# Patient Record
Sex: Male | Born: 1990 | Race: Black or African American | Hispanic: No | Marital: Single | Smoking: Current some day smoker
Health system: Southern US, Community
[De-identification: ages and names within clinical notes are randomized; demographics above are authoritative.]

## PROBLEM LIST (undated history)

## (undated) DIAGNOSIS — F419 Anxiety disorder, unspecified: Secondary | ICD-10-CM

---
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
## 2009-01-23 ENCOUNTER — Emergency Department (HOSPITAL_BASED_OUTPATIENT_CLINIC_OR_DEPARTMENT_OTHER): Admission: EM | Admit: 2009-01-23 | Discharge: 2009-01-23 | Payer: Self-pay | Admitting: Emergency Medicine

## 2009-02-18 ENCOUNTER — Emergency Department (HOSPITAL_COMMUNITY): Admission: EM | Admit: 2009-02-18 | Discharge: 2009-02-19 | Payer: Self-pay | Admitting: Emergency Medicine

## 2009-03-17 ENCOUNTER — Emergency Department (HOSPITAL_COMMUNITY): Admission: EM | Admit: 2009-03-17 | Discharge: 2009-03-17 | Payer: Self-pay | Admitting: Emergency Medicine

## 2010-02-02 ENCOUNTER — Emergency Department (HOSPITAL_BASED_OUTPATIENT_CLINIC_OR_DEPARTMENT_OTHER): Admission: EM | Admit: 2010-02-02 | Discharge: 2010-02-02 | Payer: Self-pay | Admitting: Emergency Medicine

## 2010-09-10 LAB — BASIC METABOLIC PANEL
BUN: 9 mg/dL (ref 6–23)
CO2: 31 mEq/L (ref 19–32)
Calcium: 9.5 mg/dL (ref 8.4–10.5)
Chloride: 103 mEq/L (ref 96–112)
Creatinine, Ser: 0.9 mg/dL (ref 0.4–1.5)
GFR calc Af Amer: >60 mL/min (ref >60)
GFR calc non Af Amer: >60 mL/min (ref >60)
Glucose, Bld: 102 mg/dL — ABNORMAL HIGH (ref 70–99)
Potassium: 3.6 mEq/L (ref 3.5–5.1)
Sodium: 138 mEq/L (ref 135–145)

## 2010-09-10 LAB — CBC
HCT: 46.2 % (ref 39.0–52.0)
Hemoglobin: 15.8 g/dL (ref 13.0–17.0)
Platelets: 210 10*3/uL (ref 150–400)
RBC: 4.8 MIL/uL (ref 4.22–5.81)
WBC: 7.7 10*3/uL (ref 4.0–10.5)

## 2010-09-10 LAB — DIFFERENTIAL
Eosinophils Absolute: 0.1 10*3/uL (ref 0.0–0.7)
Lymphs Abs: 2.1 10*3/uL (ref 0.7–4.0)
Neutro Abs: 4.6 10*3/uL (ref 1.7–7.7)
Neutrophils Relative %: 60 % (ref 43–77)

## 2010-09-10 LAB — CK TOTAL AND CKMB (NOT AT ARMC): Relative Index: 0.5 (ref 0.0–2.5)

## 2010-09-10 LAB — D-DIMER, QUANTITATIVE: D-Dimer, Quant: <0.22 ug/mL-FEU (ref 0.00–0.48)

## 2011-09-13 ENCOUNTER — Emergency Department (HOSPITAL_COMMUNITY)
Admission: EM | Admit: 2011-09-13 | Discharge: 2011-09-13 | Disposition: A | Discharge status: Home / Self Care | Payer: Self-pay | Attending: Emergency Medicine | Admitting: Emergency Medicine

## 2011-09-13 ENCOUNTER — Encounter (HOSPITAL_COMMUNITY): Payer: Self-pay

## 2011-09-13 DIAGNOSIS — F17200 Nicotine dependence, unspecified, uncomplicated: Secondary | ICD-10-CM | POA: Insufficient documentation

## 2011-09-13 DIAGNOSIS — S2095XA Superficial foreign body of unspecified parts of thorax, initial encounter: Secondary | ICD-10-CM | POA: Insufficient documentation

## 2011-09-13 DIAGNOSIS — W57XXXA Bitten or stung by nonvenomous insect and other nonvenomous arthropods, initial encounter: Secondary | ICD-10-CM

## 2011-09-13 MED ORDER — LIDOCAINE HCL 1 % IJ SOLN
INTRAMUSCULAR | Status: AC
  Filled 2011-09-13: qty 20

## 2011-09-13 NOTE — Discharge Instructions (Signed)
Insect Bite Mosquitoes, flies, fleas, bedbugs, and many other insects can bite. Insect bites are different from insect stings. A sting is when venom is injected into the skin. Some insect bites can transmit infectious diseases. SYMPTOMS  Insect bites usually turn red, swell, and itch for 2 to 4 days. They often go away on their own. TREATMENT  Your caregiver may prescribe antibiotic medicines if a bacterial infection develops in the bite. HOME CARE INSTRUCTIONS  Do not scratch the bite area.   Keep the bite area clean and dry. Wash the bite area thoroughly with soap and water.   Put ice or cool compresses on the bite area.   Put ice in a plastic bag.   Place a towel between your skin and the bag.   Leave the ice on for 20 minutes, 4 times a day for the first 2 to 3 days, or as directed.   You may apply a baking soda paste, cortisone cream, or calamine lotion to the bite area as directed by your caregiver. This can help reduce itching and swelling.   Only take over-the-counter or prescription medicines as directed by your caregiver.   If you are given antibiotics, take them as directed. Finish them even if you start to feel better.  You may need a tetanus shot if:  You cannot remember when you had your last tetanus shot.   You have never had a tetanus shot.   The injury broke your skin.  If you get a tetanus shot, your arm may swell, get red, and feel warm to the touch. This is common and not a problem. If you need a tetanus shot and you choose not to have one, there is a rare chance of getting tetanus. Sickness from tetanus can be serious. SEEK IMMEDIATE MEDICAL CARE IF:   You have increased pain, redness, or swelling in the bite area.   You see a red line on the skin coming from the bite.   You have a fever.   You have joint pain.   You have a headache or neck pain.   You have unusual weakness.   You have a rash.   You have chest pain or shortness of breath.   You  have abdominal pain, nausea, or vomiting.   You feel unusually tired or sleepy.  MAKE SURE YOU:   Understand these instructions.   Will watch your condition.   Will get help right away if you are not doing well or get worse.  Document Released: 06/30/2004 Document Revised: 05/12/2011 Document Reviewed: 12/22/2010 Exit[employer] Patient Information 2012 [employer]. 

## 2011-09-13 NOTE — ED Notes (Signed)
Pt visits the ED with a compliant that a tick is on his right buttock that he noticed today. Denies fever or joint pain. During inspection small reddened area under the right buttock with a black area in the middle that appears as if something it stuck in it. Pt states that it is tender but no moderate pain.

## 2011-09-13 NOTE — ED Provider Notes (Signed)
History     CSN: [id number]  Arrival date & time 09/13/11  2043   First MD Initiated Contact with Patient 09/13/11 2308      Chief Complaint  Patient presents with  . Insect Bite    (Consider location/radiation/quality/duration/timing/severity/associated sxs/prior treatment) Patient is a 21 y.o. male presenting with foreign body. The history is provided by the patient. No language interpreter was used.  Foreign Body  The current episode started 12 to 24 hours ago.  Patient spent some time in a wooded area yesterday.  Noted what appeared to be a small tick in the fold under the right buttock with mild induration.  Patient attempted to remove, but left part of the body and head of the tick behind.  History reviewed. No pertinent past medical history.  History reviewed. No pertinent past surgical history.  No family history on file.  History  Substance Use Topics  . Smoking status: Current Some Day Smoker    Types: Cigarettes  . Smokeless tobacco: Not on file  . Alcohol Use: No      Review of Systems  Skin: Positive for wound.  All other systems reviewed and are negative.    Allergies  Review of patient's allergies indicates no known allergies.  Home Medications  No current outpatient prescriptions on file.  BP 118/74  Temp(Src) 98 F (36.7 C) (Oral)  Resp 14  Ht 6\' 2"  (1.88 m)  Wt 175 lb (79.379 kg)  BMI 22.47 kg/m2  SpO2 100%  Physical Exam  Nursing note and vitals reviewed. Constitutional: He is oriented to person, place, and time. He appears well-developed and well-nourished. No distress.  HENT:  Head: Normocephalic and atraumatic.  Eyes: Pupils are equal, round, and reactive to light.  Neck: Normal range of motion. Neck supple.  Cardiovascular: Normal rate, regular rhythm, normal heart sounds and intact distal pulses.   Pulmonary/Chest: Effort normal and breath sounds normal.  Abdominal: Soft. Bowel sounds are normal.  Musculoskeletal: Normal range  of motion.  Lymphadenopathy:    He has no cervical adenopathy.  Neurological: He is alert and oriented to person, place, and time.  Skin: Skin is warm and dry.  Psychiatric: He has a normal mood and affect. His behavior is normal. Judgment and thought content normal.    ED Course  Procedures (including critical care time) Retained foreign body removed with hemostats. Labs Reviewed - No data to display No results found.   No diagnosis found.   Retained foreign body (tick), removed MDM          Jimmye Norman, NP 09/14/11 [phone]

## 2011-09-14 NOTE — ED Provider Notes (Signed)
Medical screening examination/treatment/procedure(s) were performed by non-physician practitioner and as supervising physician I was immediately available for consultation/collaboration.   Vida Roller, MD 09/14/11 [phone]

## 2013-01-20 ENCOUNTER — Encounter (HOSPITAL_COMMUNITY): Payer: Self-pay | Admitting: *Deleted

## 2013-01-20 ENCOUNTER — Emergency Department (HOSPITAL_COMMUNITY)
Admission: EM | Admit: 2013-01-20 | Discharge: 2013-01-20 | Disposition: A | Discharge status: Home / Self Care | Payer: Self-pay | Attending: Emergency Medicine | Admitting: Emergency Medicine

## 2013-01-20 DIAGNOSIS — R112 Nausea with vomiting, unspecified: Secondary | ICD-10-CM | POA: Insufficient documentation

## 2013-01-20 DIAGNOSIS — R319 Hematuria, unspecified: Secondary | ICD-10-CM | POA: Insufficient documentation

## 2013-01-20 DIAGNOSIS — R109 Unspecified abdominal pain: Secondary | ICD-10-CM | POA: Insufficient documentation

## 2013-01-20 DIAGNOSIS — F17200 Nicotine dependence, unspecified, uncomplicated: Secondary | ICD-10-CM | POA: Insufficient documentation

## 2013-01-20 LAB — COMPREHENSIVE METABOLIC PANEL
ALT: 18 U/L (ref 0–53)
AST: 19 U/L (ref 0–37)
Albumin: 4.3 g/dL (ref 3.5–5.2)
Alkaline Phosphatase: 54 U/L (ref 39–117)
BUN: 11 mg/dL (ref 6–23)
CO2: 30 mEq/L (ref 19–32)
Calcium: 9.8 mg/dL (ref 8.4–10.5)
Chloride: 102 mEq/L (ref 96–112)
Creatinine, Ser: 1.08 mg/dL (ref 0.50–1.35)
GFR calc Af Amer: >90 mL/min (ref >90)
GFR calc non Af Amer: >90 mL/min (ref >90)
Glucose, Bld: 113 mg/dL — ABNORMAL HIGH (ref 70–99)
Potassium: 4.3 mEq/L (ref 3.5–5.1)
Sodium: 140 mEq/L (ref 135–145)
Total Bilirubin: 0.4 mg/dL (ref 0.3–1.2)
Total Protein: 7 g/dL (ref 6.0–8.3)

## 2013-01-20 LAB — URINALYSIS, ROUTINE W REFLEX MICROSCOPIC
Glucose, UA: NEGATIVE mg/dL
Ketones, ur: NEGATIVE mg/dL
Protein, ur: 30 mg/dL — AB

## 2013-01-20 LAB — CBC WITH DIFFERENTIAL/PLATELET
Basophils Absolute: 0 10*3/uL (ref 0.0–0.1)
HCT: 44.4 % (ref 39.0–52.0)
Lymphocytes Relative: 16 % (ref 12–46)
Monocytes Absolute: 0.8 10*3/uL (ref 0.1–1.0)
Neutro Abs: 6.8 10*3/uL (ref 1.7–7.7)
Platelets: 197 10*3/uL (ref 150–400)
RDW: 12.6 % (ref 11.5–15.5)
WBC: 9 10*3/uL (ref 4.0–10.5)

## 2013-01-20 LAB — RAPID URINE DRUG SCREEN, HOSP PERFORMED
Amphetamines: NOT DETECTED
Tetrahydrocannabinol: POSITIVE — AB

## 2013-01-20 LAB — URINE MICROSCOPIC-ADD ON

## 2013-01-20 LAB — LIPASE, BLOOD: Lipase: 41 U/L (ref 11–59)

## 2013-01-20 MED ORDER — SODIUM CHLORIDE 0.9 % IV BOLUS (SEPSIS)
1000.0000 mL | Freq: Once | INTRAVENOUS | Status: AC
  Administered 2013-01-20: 1000 mL via INTRAVENOUS

## 2013-01-20 MED ORDER — ONDANSETRON 4 MG PO TBDP
8.0000 mg | ORAL_TABLET | Freq: Once | ORAL | Status: AC
  Administered 2013-01-20: 8 mg via ORAL
  Filled 2013-01-20: qty 2

## 2013-01-20 MED ORDER — PROMETHAZINE HCL 25 MG PO TABS: 25.0000 mg | ORAL_TABLET | Freq: Four times a day (QID) | ORAL | Status: DC | PRN

## 2013-01-20 MED ORDER — ONDANSETRON HCL 4 MG/2ML IJ SOLN
4.0000 mg | Freq: Once | INTRAMUSCULAR | Status: AC
  Administered 2013-01-20: 4 mg via INTRAVENOUS
  Filled 2013-01-20: qty 2

## 2013-01-20 MED ORDER — MORPHINE SULFATE 4 MG/ML IJ SOLN
4.0000 mg | Freq: Once | INTRAMUSCULAR | Status: AC
  Administered 2013-01-20: 4 mg via INTRAVENOUS
  Filled 2013-01-20: qty 1

## 2013-01-20 NOTE — ED Notes (Signed)
Discharge instructions reviewed with pt. Pt verbalized understanding.   

## 2013-01-20 NOTE — ED Provider Notes (Signed)
Medical screening examination/treatment/procedure(s) were performed by non-physician practitioner and as supervising physician I was immediately available for consultation/collaboration.  Etheridge Geil N Ligia Duguay, DO 01/20/13 1653 

## 2013-01-20 NOTE — ED Provider Notes (Signed)
CSN: [id number]     Arrival date & time 01/20/13  6213 History     First MD Initiated Contact with Patient 01/20/13 1020     Chief Complaint  Patient presents with  . Vomiting   (Consider location/radiation/quality/duration/timing/severity/associated sxs/prior Treatment) HPI Patient is a 22 year old male who came into the emergency department today complaining of nausea, vomiting, abdominal pain. Patient states his pain began this morning it woke him up from sleep. Patient has had around 6 episodes of emesis with the last episode here in the emergency department in triage. Patient states his pain did improve with the patient denies any changes in his bowels, denies diarrhea. Patient denies any fever or chills. Patient denies taking any medications for these symptoms at home. Patient states he did eat Chinese last night and believes he might have eaten something bad. Patient denies any prior medical problems, denies any prior abdominal injuries or surgeries. Patient denies any alcohol or drug use. History reviewed. No pertinent past medical history. History reviewed. No pertinent past surgical history. History reviewed. No pertinent family history. History  Substance Use Topics  . Smoking status: Current Some Day Smoker    Types: Cigarettes  . Smokeless tobacco: Not on file  . Alcohol Use: No    Review of Systems  Constitutional: Negative for fever and chills.  HENT: Negative for neck pain and neck stiffness.   Respiratory: Negative.   Cardiovascular: Negative.   Gastrointestinal: Positive for nausea, vomiting and abdominal pain. Negative for diarrhea and constipation.  Genitourinary: Negative for dysuria and flank pain.  Musculoskeletal: Negative for back pain.  Skin: Negative.   Neurological: Negative for weakness, numbness and headaches.    Allergies  Review of patient's allergies indicates no known allergies.  Home Medications  No current outpatient prescriptions on  file. BP 124/67  Pulse 67  Temp(Src) 98 F (36.7 C) (Oral)  Resp 16  SpO2 100% Physical Exam  Nursing note and vitals reviewed. Constitutional: He appears well-developed and well-nourished. No distress.  HENT:  Head: Normocephalic and atraumatic.  Eyes: Conjunctivae are normal.  Neck: Neck supple.  Cardiovascular: Normal rate, regular rhythm and normal heart sounds.   Pulmonary/Chest: Effort normal. No respiratory distress. He has no wheezes. He has no rales.  Abdominal: Soft. Bowel sounds are normal. He exhibits no distension. There is no tenderness. There is no rebound.  Musculoskeletal: He exhibits no edema.  Neurological: He is alert.  Skin: Skin is warm and dry.    ED Course   Procedures (including critical care time) Results for orders placed during the hospital encounter of 01/20/13  CBC WITH DIFFERENTIAL      Result Value Range   WBC 9.0  4.0 - 10.5 K/uL   RBC 4.97  4.22 - 5.81 MIL/uL   Hemoglobin 16.1  13.0 - 17.0 g/dL   HCT 08.6  57.8 - 46.9 %   MCV 89.3  78.0 - 100.0 fL   MCH 32.4  26.0 - 34.0 pg   MCHC 36.3 (*) 30.0 - 36.0 g/dL   RDW 62.9  52.8 - 41.3 %   Platelets 197  150 - 400 K/uL   Neutrophils Relative % 75  43 - 77 %   Neutro Abs 6.8  1.7 - 7.7 K/uL   Lymphocytes Relative 16  12 - 46 %   Lymphs Abs 1.4  0.7 - 4.0 K/uL   Monocytes Relative 9  3 - 12 %   Monocytes Absolute 0.8  0.1 - 1.0 K/uL  Eosinophils Relative 1  0 - 5 %   Eosinophils Absolute 0.1  0.0 - 0.7 K/uL   Basophils Relative 0  0 - 1 %   Basophils Absolute 0.0  0.0 - 0.1 K/uL  COMPREHENSIVE METABOLIC PANEL      Result Value Range   Sodium 140  135 - 145 mEq/L   Potassium 4.3  3.5 - 5.1 mEq/L   Chloride 102  96 - 112 mEq/L   CO2 30  19 - 32 mEq/L   Glucose, Bld 113 (*) 70 - 99 mg/dL   BUN 11  6 - 23 mg/dL   Creatinine, Ser 1.61  0.50 - 1.35 mg/dL   Calcium 9.8  8.4 - 09.6 mg/dL   Total Protein 7.0  6.0 - 8.3 g/dL   Albumin 4.3  3.5 - 5.2 g/dL   AST 19  0 - 37 U/L   ALT 18  0 -  53 U/L   Alkaline Phosphatase 54  39 - 117 U/L   Total Bilirubin 0.4  0.3 - 1.2 mg/dL   GFR calc non Af Amer >90  >90 mL/min   GFR calc Af Amer >90  >90 mL/min  LIPASE, BLOOD      Result Value Range   Lipase 41  11 - 59 U/L  URINALYSIS, ROUTINE W REFLEX MICROSCOPIC      Result Value Range   Color, Urine AMBER (*) YELLOW   APPearance CLOUDY (*) CLEAR   Specific Gravity, Urine 1.028  1.005 - 1.030   pH 5.5  5.0 - 8.0   Glucose, UA NEGATIVE  NEGATIVE mg/dL   Hgb urine dipstick LARGE (*) NEGATIVE   Bilirubin Urine SMALL (*) NEGATIVE   Ketones, ur NEGATIVE  NEGATIVE mg/dL   Protein, ur 30 (*) NEGATIVE mg/dL   Urobilinogen, UA 0.2  0.0 - 1.0 mg/dL   Nitrite NEGATIVE  NEGATIVE   Leukocytes, UA NEGATIVE  NEGATIVE  URINE RAPID DRUG SCREEN (HOSP PERFORMED)      Result Value Range   Opiates NONE DETECTED  NONE DETECTED   Cocaine NONE DETECTED  NONE DETECTED   Benzodiazepines NONE DETECTED  NONE DETECTED   Amphetamines NONE DETECTED  NONE DETECTED   Tetrahydrocannabinol POSITIVE (*) NONE DETECTED   Barbiturates NONE DETECTED  NONE DETECTED  URINE MICROSCOPIC-ADD ON      Result Value Range   Squamous Epithelial / LPF RARE  RARE   WBC, UA 0-2  <3 WBC/hpf   RBC / HPF 21-50  <3 RBC/hpf   Urine-Other MUCOUS PRESENT     No results found.   No results found. 1. Nausea & vomiting   2. Abdominal  pain, other specified site   3. Hematuria     MDM  Patient with nausea vomiting, diffuse abdominal pain onset this morning. Patient has not vomited in the emergency department since arrival. He is treated with IV fluids, Zofran, morphine for pain. Patient states he's feeling much better. He received 2 L of normal saline. His labs are all unremarkable. His urine did show large hemoglobin small bilirubin. This will need further recheck for followup patient's abdomen reassessed, it is non-tender. There is no guarding or rebound tenderness. Doubt acute abdomen at this time. Patient is tolerating by  mouth fluids. Patient will be discharged home with antiemetics and close outpatient followup. Patient was instructed to return to emergency Department if symptoms are worsening.  Filed Vitals:   01/20/13 1100 01/20/13 1200 01/20/13 1251 01/20/13 1300  BP: 102/61 93/45 112/66  117/65  Pulse: 55 58 60 67  Temp:      TempSrc:      Resp:      SpO2: 100% 100% 100% 99%     Kayani Rapaport A Ethan Clayburn, PA-C 01/20/13 1328

## 2013-01-20 NOTE — ED Notes (Signed)
Pt actively vomiting at triage, will not answer any questions, will not sit still for blood draw.

## 2013-01-20 NOTE — ED Notes (Signed)
Tatyana PA at bedside   

## 2014-03-19 ENCOUNTER — Emergency Department (HOSPITAL_COMMUNITY)
Admission: EM | Admit: 2014-03-19 | Discharge: 2014-03-20 | Disposition: A | Discharge status: Home / Self Care | Payer: Self-pay | Attending: Emergency Medicine | Admitting: Emergency Medicine

## 2014-03-19 ENCOUNTER — Encounter (HOSPITAL_COMMUNITY): Payer: Self-pay | Admitting: Emergency Medicine

## 2014-03-19 DIAGNOSIS — R05 Cough: Secondary | ICD-10-CM | POA: Insufficient documentation

## 2014-03-19 DIAGNOSIS — R059 Cough, unspecified: Secondary | ICD-10-CM

## 2014-03-19 DIAGNOSIS — R0982 Postnasal drip: Secondary | ICD-10-CM | POA: Insufficient documentation

## 2014-03-19 DIAGNOSIS — J029 Acute pharyngitis, unspecified: Secondary | ICD-10-CM | POA: Insufficient documentation

## 2014-03-19 DIAGNOSIS — Z720 Tobacco use: Secondary | ICD-10-CM | POA: Insufficient documentation

## 2014-03-19 HISTORY — DX: Anxiety disorder, unspecified: F41.9

## 2014-03-19 NOTE — ED Notes (Signed)
Pt reports feeling something in his throat, states he thinks it's mucus and keeps trying to cough it out but it's still there. Pt reports it's making him feels anxious.

## 2014-03-19 NOTE — ED Provider Notes (Signed)
CSN: [id number]     Arrival date & time 03/19/14  2247 History   First MD Initiated Contact with Patient 03/19/14 2336     Chief Complaint  Patient presents with  . Foreign Body  . Anxiety     (Consider location/radiation/quality/duration/timing/severity/associated sxs/prior Treatment) Patient is a 23 y.o. male presenting with pharyngitis.  Sore Throat This is a new problem. Episode onset: 1 month ago. The problem occurs constantly. The problem has not changed since onset.Pertinent negatives include no chest pain, no abdominal pain, no headaches and no shortness of breath. Nothing aggravates the symptoms. Nothing relieves the symptoms. He has tried nothing for the symptoms.    Past Medical History  Diagnosis Date  . Anxiety    History reviewed. No pertinent past surgical history. No family history on file. History  Substance Use Topics  . Smoking status: Current Some Day Smoker    Types: Cigarettes  . Smokeless tobacco: Not on file  . Alcohol Use: No    Review of Systems  Respiratory: Negative for shortness of breath.   Cardiovascular: Negative for chest pain.  Gastrointestinal: Negative for abdominal pain.  Neurological: Negative for headaches.  All other systems reviewed and are negative.     Allergies  Review of patient's allergies indicates no known allergies.  Home Medications   Prior to Admission medications   Medication Sig Start Date End Date Taking? Authorizing Provider  benzonatate (TESSALON) 100 MG capsule Take 1 capsule (100 mg total) by mouth every 8 (eight) hours. 03/20/14   Mirian MoMatthew Braelon Sprung, MD   BP 124/74  Pulse 58  Temp(Src) 97.7 F (36.5 C) (Oral)  Resp 16  Ht 6\' 2"  (1.88 m)  Wt 165 lb (74.844 kg)  BMI 21.18 kg/m2  SpO2 100% Physical Exam  Vitals reviewed. Constitutional: He is oriented to person, place, and time. He appears well-developed and well-nourished.  HENT:  Head: Normocephalic and atraumatic.  Mouth/Throat: Oropharynx is clear  and moist and mucous membranes are normal.  Eyes: Conjunctivae and EOM are normal.  Neck: Normal range of motion. Neck supple.  Cardiovascular: Normal rate, regular rhythm and normal heart sounds.   Pulmonary/Chest: Effort normal and breath sounds normal. No respiratory distress.  Abdominal: He exhibits no distension. There is no tenderness. There is no rebound and no guarding.  Musculoskeletal: Normal range of motion.  Neurological: He is alert and oriented to person, place, and time.  Skin: Skin is warm and dry.    ED Course  Procedures (including critical care time) Labs Review Labs Reviewed - No data to display  Imaging Review Dg Chest 2 View  03/20/2014   CLINICAL DATA:  Initial evaluation for 1 month history of cough.  EXAM: CHEST  2 VIEW  COMPARISON:  Prior radiograph from 02/19/2009  FINDINGS: The cardiac and mediastinal silhouettes are stable in size and contour, and remain within normal limits.  The lungs are normally inflated. No airspace consolidation, pleural effusion, or pulmonary edema is identified. There is no pneumothorax.  No acute osseous abnormality identified.  IMPRESSION: No active cardiopulmonary disease.   Electronically Signed   By: Rise MuBenjamin  McClintock M.D.   On: 03/20/2014 01:12     EKG Interpretation None      MDM   Final diagnoses:  Cough  Post-nasal drip    23 y.o. male with pertinent PMH of anxiety presents with 1 month of nonproductive cough, rhinorrhea, foreign body sensation in throat.  No foreign bodies ingested or aspirated on history.  No fever, gi  symptoms, or other concerning historical elements.  On arrival vitals and physical exam as above.  Oropharynx clear, small amount of LAD bil anterior cervical chains.  Pt is a current smoker.  CXR obtained and unremarkable.  Pt given prescription for tussionex and is to take OTC lozenge and antihistamine.  DC home in stable condition.    1. Cough   2. Post-nasal drip         Mirian MoMatthew Nasire Reali,  MD 03/20/14 [phone]

## 2014-03-20 ENCOUNTER — Emergency Department (HOSPITAL_COMMUNITY): Payer: Self-pay

## 2014-03-20 MED ORDER — BENZONATATE 100 MG PO CAPS: 100.0000 mg | ORAL_CAPSULE | Freq: Three times a day (TID) | ORAL | Status: AC

## 2014-03-20 NOTE — Discharge Instructions (Signed)
Cough, Adult  A cough is a reflex that helps clear your throat and airways. It can help heal the body or may be a reaction to an irritated airway. A cough may only last 2 or 3 weeks (acute) or may last more than 8 weeks (chronic).  CAUSES Acute cough:  Viral or bacterial infections. Chronic cough:  Infections.  Allergies.  Asthma.  Post-nasal drip.  Smoking.  Heartburn or acid reflux.  Some medicines.  Chronic lung problems (COPD).  Cancer. SYMPTOMS   Cough.  Fever.  Chest pain.  Increased breathing rate.  High-pitched whistling sound when breathing (wheezing).  Colored mucus that you cough up (sputum). TREATMENT   A bacterial cough may be treated with antibiotic medicine.  A viral cough must run its course and will not respond to antibiotics.  Your caregiver may recommend other treatments if you have a chronic cough. HOME CARE INSTRUCTIONS   Only take over-the-counter or prescription medicines for pain, discomfort, or fever as directed by your caregiver. Use cough suppressants only as directed by your caregiver.  Use a cold steam vaporizer or humidifier in your bedroom or home to help loosen secretions.  Sleep in a semi-upright position if your cough is worse at night.  Rest as needed.  Stop smoking if you smoke. SEEK IMMEDIATE MEDICAL CARE IF:   You have pus in your sputum.  Your cough starts to worsen.  You cannot control your cough with suppressants and are losing sleep.  You begin coughing up blood.  You have difficulty breathing.  You develop pain which is getting worse or is uncontrolled with medicine.  You have a fever. MAKE SURE YOU:   Understand these instructions.  Will watch your condition.  Will get help right away if you are not doing well or get worse. Document Released: 11/19/2010 Document Revised: 08/15/2011 Document Reviewed: 11/19/2010 [employer] Patient Information 2015 [employer]. This information is not intended  to replace advice given to you by your health care provider. Make sure you discuss any questions you have with your health care provider.  

## 2014-08-15 ENCOUNTER — Encounter (HOSPITAL_COMMUNITY): Payer: Self-pay | Admitting: Emergency Medicine

## 2014-08-15 ENCOUNTER — Emergency Department (HOSPITAL_COMMUNITY)
Admission: EM | Admit: 2014-08-15 | Discharge: 2014-08-15 | Disposition: A | Discharge status: Home / Self Care | Payer: Self-pay | Attending: Emergency Medicine | Admitting: Emergency Medicine

## 2014-08-15 DIAGNOSIS — H53149 Visual discomfort, unspecified: Secondary | ICD-10-CM | POA: Insufficient documentation

## 2014-08-15 DIAGNOSIS — R51 Headache: Secondary | ICD-10-CM | POA: Insufficient documentation

## 2014-08-15 DIAGNOSIS — Z8659 Personal history of other mental and behavioral disorders: Secondary | ICD-10-CM | POA: Insufficient documentation

## 2014-08-15 DIAGNOSIS — H109 Unspecified conjunctivitis: Secondary | ICD-10-CM | POA: Insufficient documentation

## 2014-08-15 DIAGNOSIS — Z720 Tobacco use: Secondary | ICD-10-CM | POA: Insufficient documentation

## 2014-08-15 MED ORDER — TOBRAMYCIN 0.3 % OP SOLN: 1.0000 [drp] | OPHTHALMIC | Status: AC

## 2014-08-15 MED ORDER — FLUORESCEIN SODIUM 1 MG OP STRP
1.0000 | ORAL_STRIP | Freq: Once | OPHTHALMIC | Status: AC
  Administered 2014-08-15: 1 via OPHTHALMIC
  Filled 2014-08-15: qty 1

## 2014-08-15 MED ORDER — TETRACAINE HCL 0.5 % OP SOLN
1.0000 [drp] | Freq: Once | OPHTHALMIC | Status: AC
  Administered 2014-08-15: 1 [drp] via OPHTHALMIC
  Filled 2014-08-15: qty 2

## 2014-08-15 NOTE — ED Notes (Signed)
Pt reports waking up this morning feeling like something was in his eye. Pt states it feels like his eye his scratched. Pt unable to go to work because he is unable to open right eye.

## 2014-08-15 NOTE — ED Provider Notes (Signed)
CSN: [id number]     Arrival date & time 08/15/14  1953 History  This chart was scribed for Antony Madura, PA-C working with Jerelyn Scott, MD by Evon Slack, ED Scribe. This patient was seen in room WTR9/WTR9 and the patient's care was started at 9:58 PM.     Chief Complaint  Patient presents with  . Eye Pain   The history is provided by the patient. No language interpreter was used.   HPI Comments: Bryan Stein is a 24 y.o. male who presents to the Emergency Department complaining of new worsening right eye pain that began 1 day ago. Pt states that he has associated eye itching, eye redness and eye discharge morning. Pt states that light makes the pain worse causing him to have a HA. Pt states that he has tried eye drops with no relief. Pt denies fever, eye discharge or other related symptoms. Pt denies any trauma to the eye. Pt states that he doesn not wear contacts.   Past Medical History  Diagnosis Date  . Anxiety    History reviewed. No pertinent past surgical history. History reviewed. No pertinent family history. History  Substance Use Topics  . Smoking status: Current Some Day Smoker    Types: Cigarettes  . Smokeless tobacco: Not on file  . Alcohol Use: No    Review of Systems  Constitutional: Negative for fever.  Eyes: Positive for photophobia, pain, redness and itching. Negative for discharge.  Neurological: Positive for headaches.  All other systems reviewed and are negative.   Allergies  Review of patient's allergies indicates no known allergies.  Home Medications   Prior to Admission medications   Medication Sig Start Date End Date Taking? Authorizing Provider  benzonatate (TESSALON) 100 MG capsule Take 1 capsule (100 mg total) by mouth every 8 (eight) hours. 03/20/14   Mirian Mo, MD  tobramycin (TOBREX) 0.3 % ophthalmic solution Place 1 drop into the right eye every 4 (four) hours. Place one drop in the affected eye every 4 hours for 7 days 08/15/14    Antony Madura, PA-C   BP 127/73 mmHg  Pulse 71  Temp(Src) 98.5 F (36.9 C) (Oral)  Resp 16  SpO2 100%   Physical Exam  Constitutional: He is oriented to person, place, and time. He appears well-developed and well-nourished. No distress.  Nontoxic/nonseptic appearing  HENT:  Head: Normocephalic and atraumatic.  Mouth/Throat: Oropharynx is clear and moist. No oropharyngeal exudate.  Eyes: EOM are normal. Pupils are equal, round, and reactive to light. Lids are everted and swept, no foreign bodies found. Right conjunctiva is injected. Right conjunctiva has no hemorrhage. Left conjunctiva is not injected. Left conjunctiva has no hemorrhage. No scleral icterus.  No proptosis or hyphema. No foreign body visualized. No fluorescein uptake on staining. No evidence of corneal abrasion or ulcer. Negative Sidel's sign. IOP 18 with 95% CI. No consensual photophobia. Snellen 20/30 OS, OD, OU.  Neck: Normal range of motion.  Pulmonary/Chest: Effort normal. No respiratory distress.  Musculoskeletal: Normal range of motion.  Neurological: He is alert and oriented to person, place, and time. He exhibits normal muscle tone. Coordination normal.  Skin: Skin is warm and dry. No rash noted. He is not diaphoretic. No erythema. No pallor.  Psychiatric: He has a normal mood and affect. His behavior is normal.  Nursing note and vitals reviewed.   ED Course  Procedures (including critical care time) DIAGNOSTIC STUDIES: Oxygen Saturation is 100% on RA, normal by my interpretation.  COORDINATION OF CARE: 10:20 PM-Discussed treatment plan with pt at bedside and pt agreed to plan.   Labs Review Labs Reviewed - No data to display  Imaging Review No results found.   EKG Interpretation None      MDM   Final diagnoses:  Conjunctivitis of right eye    24 year old male percent to the emergency department for further evaluation of right eye pain and redness. No foreign body visualized. No evidence of  corneal abrasion or ulcer. No findings concerning for globe rupture. Normal intraocular pressure, ruling out acute glaucoma. No history of trauma or injury to the eye. Suspect that symptoms are secondary to viral conjunctivitis, will cover for bacterial conjunctivitis with Tobrex. Patient given ophthalmology follow-up for recheck of symptoms on Monday. Return precautions provided. Patient agreeable to plan with known address concerns. Case discussed with Dr. Karma GanjaLinker who is in agreement with work up and management plan.  I personally performed the services described in this documentation, which was scribed in my presence. The recorded information has been reviewed and is accurate.   Filed Vitals:   08/15/14 2036  BP: 127/73  Pulse: 71  Temp: 98.5 F (36.9 C)  TempSrc: Oral  Resp: 16  SpO2: 100%       Antony MaduraKelly Cortez Steelman, PA-C 08/15/14 2302  Jerelyn ScottMartha Linker, MD 08/15/14 2303

## 2014-08-15 NOTE — Discharge Instructions (Signed)

## 2014-08-15 NOTE — ED Notes (Signed)
Called for room, no answer

## 2014-10-04 ENCOUNTER — Emergency Department (HOSPITAL_COMMUNITY)
Admission: EM | Admit: 2014-10-04 | Discharge: 2014-10-04 | Disposition: A | Discharge status: Home / Self Care | Payer: Self-pay | Attending: Emergency Medicine | Admitting: Emergency Medicine

## 2014-10-04 ENCOUNTER — Encounter (HOSPITAL_COMMUNITY): Payer: Self-pay

## 2014-10-04 DIAGNOSIS — H6121 Impacted cerumen, right ear: Secondary | ICD-10-CM | POA: Insufficient documentation

## 2014-10-04 DIAGNOSIS — R0981 Nasal congestion: Secondary | ICD-10-CM | POA: Insufficient documentation

## 2014-10-04 DIAGNOSIS — J029 Acute pharyngitis, unspecified: Secondary | ICD-10-CM | POA: Insufficient documentation

## 2014-10-04 DIAGNOSIS — M791 Myalgia: Secondary | ICD-10-CM | POA: Insufficient documentation

## 2014-10-04 DIAGNOSIS — R067 Sneezing: Secondary | ICD-10-CM | POA: Insufficient documentation

## 2014-10-04 DIAGNOSIS — R05 Cough: Secondary | ICD-10-CM | POA: Insufficient documentation

## 2014-10-04 DIAGNOSIS — R509 Fever, unspecified: Secondary | ICD-10-CM | POA: Insufficient documentation

## 2014-10-04 DIAGNOSIS — Z8659 Personal history of other mental and behavioral disorders: Secondary | ICD-10-CM | POA: Insufficient documentation

## 2014-10-04 DIAGNOSIS — Z720 Tobacco use: Secondary | ICD-10-CM | POA: Insufficient documentation

## 2014-10-04 DIAGNOSIS — R6889 Other general symptoms and signs: Secondary | ICD-10-CM

## 2014-10-04 DIAGNOSIS — R51 Headache: Secondary | ICD-10-CM | POA: Insufficient documentation

## 2014-10-04 MED ORDER — GUAIFENESIN 100 MG/5ML PO LIQD: 100.0000 mg | ORAL | Status: AC | PRN

## 2014-10-04 NOTE — Discharge Instructions (Signed)

## 2014-10-04 NOTE — ED Notes (Signed)
Pt presents with c/o cough and sore throat that started two days ago. Pt reports he feels pain in his throat and is also experiencing pain in his head when he coughs. Pt reports he has been taking nyquil for the symptoms.

## 2014-10-04 NOTE — ED Provider Notes (Signed)
CSN: [id number]     Arrival date & time 10/04/14  1819 History   This chart was scribed for Fayrene HelperBowie Rydan Gulyas, PA-C working with Azalia BilisKevin Campos, MD by Evon Slackerrance Branch, ED Scribe. This patient was seen in room WTR8/WTR8 and the patient's care was started at 6:32 PM.      Chief Complaint  Patient presents with  . Sore Throat  . Cough   Patient is a 24 y.o. male presenting with pharyngitis and cough. The history is provided by the patient. No language interpreter was used.  Sore Throat Associated symptoms include headaches.  Cough Associated symptoms: chills, fever, headaches, myalgias and sore throat    HPI Comments: Bryan Stein is a 24 y.o. male who presents to the Emergency Department complaining of cough onset 2 days prior. Pt states that he has a HA that's worse when coughing and moving his eyes. Pt states that he has associated subjective fever, chills, sore throat, congestion, generilaized myalgias, occasional cough, and sneezing. PT states that he has tried Nyquil and soaking in epsom salt with no relief. Pt denies nausea, vomiting, or diarrhea.     Past Medical History  Diagnosis Date  . Anxiety    History reviewed. No pertinent past surgical history. No family history on file. History  Substance Use Topics  . Smoking status: Current Some Day Smoker    Types: Cigarettes  . Smokeless tobacco: Not on file  . Alcohol Use: No    Review of Systems  Constitutional: Positive for fever and chills.  HENT: Positive for congestion, sneezing and sore throat.   Respiratory: Positive for cough.   Gastrointestinal: Negative for nausea, vomiting and diarrhea.  Musculoskeletal: Positive for myalgias.  Neurological: Positive for headaches.    Allergies  Review of patient's allergies indicates no known allergies.  Home Medications   Prior to Admission medications   Medication Sig Start Date End Date Taking? Authorizing Provider  benzonatate (TESSALON) 100 MG capsule Take 1 capsule (100  mg total) by mouth every 8 (eight) hours. 03/20/14   Mirian MoMatthew Gentry, MD  tobramycin (TOBREX) 0.3 % ophthalmic solution Place 1 drop into the right eye every 4 (four) hours. Place one drop in the affected eye every 4 hours for 7 days 08/15/14   Antony MaduraKelly Humes, PA-C   BP 118/77 mmHg  Pulse 93  Temp(Src) 98.8 F (37.1 C) (Oral)  Resp 22  SpO2 100%   Physical Exam  Constitutional: He is oriented to person, place, and time. He appears well-developed and well-nourished. No distress.  HENT:  Head: Normocephalic and atraumatic.  Left Ear: External ear normal.  Mouth/Throat: Uvula is midline. No oropharyngeal exudate or posterior oropharyngeal edema.  Right ear cerumen impaction. No tonsillar enlargement or exudate.   Eyes: Conjunctivae and EOM are normal.  Neck: Neck supple. No tracheal deviation present.  Cardiovascular: Normal rate, regular rhythm and normal heart sounds.   Pulmonary/Chest: Effort normal. No respiratory distress. He has no wheezes.  Musculoskeletal: Normal range of motion.  Lymphadenopathy:    He has no cervical adenopathy.  No lymph adenopathy.    Neurological: He is alert and oriented to person, place, and time.  Skin: Skin is warm and dry.  Psychiatric: He has a normal mood and affect. His behavior is normal.  Nursing note and vitals reviewed.   ED Course  Procedures (including critical care time) DIAGNOSTIC STUDIES: Oxygen Saturation is 100% on RA, normal by my interpretation.    COORDINATION OF CARE: 6:42 PM-flu like sxs.  Vss.  Low suspicion for strep pharyngitis.  Doubt meningitis. No hypoxia or fever concerning for pna. Discussed treatment plan with pt at bedside and pt agreed to plan.     Labs Review Labs Reviewed - No data to display  Imaging Review No results found.   EKG Interpretation None      MDM   Final diagnoses:  Flu-like symptoms   BP 118/77 mmHg  Pulse 93  Temp(Src) 98.8 F (37.1 C) (Oral)  Resp 22  SpO2 100%   I personally  performed the services described in this documentation, which was scribed in my presence. The recorded information has been reviewed and is accurate.       Fayrene Helper, PA-C 10/04/14 1850  Azalia Bilis, MD 10/04/14 [phone]

## 2017-02-11 ENCOUNTER — Emergency Department (HOSPITAL_COMMUNITY): Admission: EM | Admit: 2017-02-11 | Discharge: 2017-02-11 | Discharge status: Against Medical Advice | Payer: Self-pay

## 2017-02-11 NOTE — ED Triage Notes (Signed)
Called for triage, no response.

## 2017-02-11 NOTE — ED Notes (Signed)
Pt was called for triage for 2nd time, no response

## 2021-09-18 ENCOUNTER — Other Ambulatory Visit: Payer: Self-pay

## 2021-09-18 ENCOUNTER — Emergency Department: Payer: Self-pay

## 2021-09-18 ENCOUNTER — Emergency Department
Admission: EM | Admit: 2021-09-18 | Discharge: 2021-09-18 | Disposition: A | Discharge status: Home / Self Care | Payer: Self-pay | Attending: Emergency Medicine | Admitting: Emergency Medicine

## 2021-09-18 DIAGNOSIS — R519 Headache, unspecified: Secondary | ICD-10-CM

## 2021-09-18 DIAGNOSIS — U071 COVID-19: Secondary | ICD-10-CM | POA: Insufficient documentation

## 2021-09-18 DIAGNOSIS — R0602 Shortness of breath: Secondary | ICD-10-CM | POA: Insufficient documentation

## 2021-09-18 LAB — BASIC METABOLIC PANEL
Anion gap: 8 (ref 5–15)
BUN: 10 mg/dL (ref 6–20)
CO2: 27 mmol/L (ref 22–32)
Calcium: 9.1 mg/dL (ref 8.9–10.3)
Chloride: 100 mmol/L (ref 98–111)
Creatinine, Ser: 1.08 mg/dL (ref 0.61–1.24)
GFR, Estimated: >60 mL/min (ref >60)
Glucose, Bld: 92 mg/dL (ref 70–99)
Potassium: 4.6 mmol/L (ref 3.5–5.1)
Sodium: 135 mmol/L (ref 135–145)

## 2021-09-18 LAB — RESP PANEL BY RT-PCR (FLU A&B, COVID) ARPGX2
Influenza A by PCR: NEGATIVE
Influenza B by PCR: NEGATIVE
SARS Coronavirus 2 by RT PCR: POSITIVE — AB

## 2021-09-18 LAB — TROPONIN I (HIGH SENSITIVITY)
Troponin I (High Sensitivity): 4 ng/L (ref <18)
Troponin I (High Sensitivity): <2 ng/L (ref <18)

## 2021-09-18 LAB — CBC
HCT: 48 % (ref 39.0–52.0)
Hemoglobin: 16.2 g/dL (ref 13.0–17.0)
MCH: 31.3 pg (ref 26.0–34.0)
MCHC: 33.8 g/dL (ref 30.0–36.0)
MCV: 92.7 fL (ref 80.0–100.0)
Platelets: 241 10*3/uL (ref 150–400)
RBC: 5.18 MIL/uL (ref 4.22–5.81)
RDW: 12.7 % (ref 11.5–15.5)
WBC: 5.6 10*3/uL (ref 4.0–10.5)
nRBC: 0 % (ref 0.0–0.2)

## 2021-09-18 LAB — SEDIMENTATION RATE: Sed Rate: 4 mm/hr (ref 0–15)

## 2021-09-18 MED ORDER — METHYLPREDNISOLONE 4 MG PO TBPK: ORAL_TABLET | ORAL | 0 refills | Status: AC

## 2021-09-18 MED ORDER — IOHEXOL 350 MG/ML SOLN
75.0000 mL | Freq: Once | INTRAVENOUS | Status: AC | PRN
  Administered 2021-09-18: 75 mL via INTRAVENOUS

## 2021-09-18 MED ORDER — IPRATROPIUM BROMIDE HFA 17 MCG/ACT IN AERS: 2.0000 | INHALATION_SPRAY | Freq: Four times a day (QID) | RESPIRATORY_TRACT | 2 refills | Status: AC

## 2021-09-18 NOTE — ED Notes (Signed)
Patient was triaged by Roylene Reason, RN but was accidentally charted under Inda Merlin, EDT. ?

## 2021-09-18 NOTE — Discharge Instructions (Addendum)
Use the Atrovent inhaler 1 puff 4 times a day with a spacer.  Take the Medrol Dosepak taper.  Follow-up with pulmonary medicine.  I have placed a consult for you they should call you to set up the appointment.  If you have not got a call or if you are still experiencing symptoms after 3-4 more days please do not hesitate to return.  I have also put in the general surgeon Dr. Hampton Abbot.  His contact information is elsewhere on this front sheet.  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  There is not any other sign of anything going on on the CAT scan. ? ?If the steroids do not help the headache as well as the cough use Motrin 3 of the over-the-counter pills 3 times a day for about a week.  That should help.  If you not having a headache of course do not take the Motrin. ? ?I would also follow-up with primary care.  If you have insurance you can see [facility] or [facility] or [facility] or [facility] or .  If you do not have insurance you can try to follow-up with the [facility] or the [facility] clinic or the [facility] or [facility] or [facility].  Most of these require some paperwork and a little bit difficult to get in because of wait times.  If you get any worse do not hesitate to come back. ? ?

## 2021-09-18 NOTE — ED Notes (Signed)
D/C, f/up, new RX's and reasons to return to ED discussed with pt, pt verbalized understanding NAD noted. Pt ambulatory with steady gait on D/C.  ?

## 2021-09-18 NOTE — ED Triage Notes (Signed)
Pt to ED via POV c/o headache, fatigue and chest tightness x 1 month. Pt states that he took OTC medication the first 2 days of his symptoms. Pt reports that his brother was diagnosed with COVID about a week ago. Pt states that his symptoms have been worse over the past few days. Pt is in NAD.  ?

## 2021-09-18 NOTE — ED Provider Notes (Signed)
? ?[facility] ?Provider Note ? ? ? Event Date/Time  ? First MD Initiated Contact with Patient 09/18/21 1027   ?  (approximate) ? ? ?History  ? ?Headache, Fatigue, and Chest Pain ? ? ?HPI ? ?Bryan Stein is a 31 y.o. male who has been sick for about a week with a cough which is mostly nonproductive diffuse achy headache and feeling very tired.  Patient reports that headache is severe and is made worse by just touching his head.  He also has a lump on the right side of his face and the right eyebrow that has been there for some time but is gotten bigger recently.  He says when the headache gets bad it looks like the blood vessels going towards it and are dilated.  Patient also complains of a cough which is sometimes intractable always dry and moderate pleuritic chest pain.  He feels short of breath frequently.  He reports that his brother has similar symptoms and was diagnosed with COVID a week ago.  Patient's symptoms have been ongoing for about a month and might of been worse in the last couple days. ? ?  ? ? ?Physical Exam  ? ?Triage Vital Signs: ?ED Triage Vitals  ?Enc Vitals Group  ?   BP 09/18/21 1005 102/63  ?   Pulse Rate 09/18/21 1005 83  ?   Resp 09/18/21 1005 16  ?   Temp 09/18/21 1005 98.1 ?F (36.7 ?C)  ?   Temp Source 09/18/21 1005 Oral  ?   SpO2 09/18/21 1005 93 %  ?   Weight --   ?   Height --   ?   Head Circumference --   ?   Peak Flow --   ?   Pain Score 09/18/21 1010 9  ?   Pain Loc --   ?   Pain Edu? --   ?   Excl. in [place]? --   ? ? ?Most recent vital signs: ?Vitals:  ? 09/18/21 1005 09/18/21 1336  ?BP: 102/63 112/81  ?Pulse: 83 (!) 59  ?Resp: 16 18  ?Temp: 98.1 ?F (36.7 ?C)   ?SpO2: 93% 97%  ? ?General: Awake, alert looks uncomfortable. ?Head normocephalic atraumatic he has a soft mobile squishy mass immediately behind the lateral part of the right eyebrow.  This is nontender ?Eyes pupils equal round reactive extraocular movements intact slight injection  bilaterally ?CV:  Good peripheral perfusion.  Heart regular rate and rhythm no audible murmurs ?Resp:  Normal effort.  Lungs are clear ?Abd:  No distention.  Abdomen soft nontender bowel sounds are positive ?Extremities without edema no rash on the skin ? ? ?ED Results / Procedures / Treatments  ? ?Labs ?(all labs ordered are listed, but only abnormal results are displayed) ?Labs Reviewed  ?RESP PANEL BY RT-PCR (FLU A&B, COVID) ARPGX2 - Abnormal; Notable for the following components:  ?    Result Value  ? SARS Coronavirus 2 by RT PCR POSITIVE (*)   ? All other components within normal limits  ?BASIC METABOLIC PANEL  ?CBC  ?SEDIMENTATION RATE  ?TROPONIN I (HIGH SENSITIVITY)  ?TROPONIN I (HIGH SENSITIVITY)  ? ? ? ?EKG ? ?EKG read and interpreted by me shows normal sinus rhythm at 77 rightward axis biatrial enlargement nonspecific intraventricular conduction delay that approximates but does not meet criteria for right bundle branch block.  Patient also has an S1 and every 3 but not at T3. ? ? ?RADIOLOGY ?Chest x-ray read by  radiology reviewed by me is negative. ?CT of the head is read as negative by radiology the soft tissue mass looks mostly black low-density probably fatty tissue is which is also what it feels like when I palpate it.  Radiology did not comment on it. ?CT angio of the chest was done to rule out PE because of the patient's symptoms the fact that he is COVID-positive and has the S1 every 3 but not the T3 in the right axis deviation on the EKG.  This was negative except for some bronchitic type changes ? ? ?PROCEDURES: ? ?Critical Care performed:  ? ?Procedures ? ? ?MEDICATIONS ORDERED IN ED: ?Medications  ?iohexol (OMNIPAQUE) 350 MG/ML injection 75 mL (75 mLs Intravenous Contrast Given 09/18/21 1238)  ? ? ? ?IMPRESSION / MDM / ASSESSMENT AND PLAN / ED COURSE  ?I reviewed the triage vital signs and the nursing notes. ?I discussed the patient in detail with Dr. Patsey Berthold on-call for pulmonary medicine.  She  recommends Atrovent inhaler and prednisone taper for the cough.  She can follow him up in the office I attempted to put in the consult the way she asked me to but apparently this is not something that we can do here in the emergency department I ended up putting in a regular consult and specified it be outpatient hopefully this will work.  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  Possibly this is gotten big enough to be pushing on a nerve it is close to the supraorbital nerve possibly this is causing some of the patient's headache. ?I will also have the patient follow-up with primary care.  I have provided him with a list of caregivers. ?Patient will return if worse. ? ?The patient is on the cardiac monitor to evaluate for evidence of arrhythmia and/or significant heart rate changes.  None were seen ? ? ? ?FINAL CLINICAL IMPRESSION(S) / ED DIAGNOSES  ? ?Final diagnoses:  ?COVID  ?Nonintractable headache, unspecified chronicity pattern, unspecified headache type  ? ? ? ?Rx / DC Orders  ? ?ED Discharge Orders   ? ?      Ordered  ?  ipratropium (ATROVENT HFA) 17 MCG/ACT inhaler  4 times daily       ?Note to Pharmacy: Please dispense with a spacer  ? 09/18/21 1338  ?  methylPREDNISolone (MEDROL DOSEPAK) 4 MG TBPK tablet       ?Note to Pharmacy: Use the 21 dose pack  ? 09/18/21 1338  ? ?  ?  ? ?  ? ? ? ?Note:  This document was prepared using Dragon voice recognition software and may include unintentional dictation errors. ?  ?Nena Polio, MD ?09/18/21 1544 ? ?

## 2023-09-13 IMAGING — CT CT ANGIO CHEST
2 of 7 series · 18 of 46 positions shown · IV contrast (APPLIED)
Comparison: Chest radiograph earlier same day

CLINICAL DATA: Pulmonary embolism (PE) suspected, unknown D-dimer
covid pos, prolonged cough, short of breath

EXAM:
CT ANGIOGRAPHY CHEST WITH CONTRAST
TECHNIQUE: Multidetector CT imaging of the chest was performed using the
standard protocol during bolus administration of intravenous
contrast. Multiplanar CT image reconstructions and MIPs were
obtained to evaluate the vascular anatomy.

[Series 7: thins · axial · 0.65mm/px · z∈[-542,-270]mm · 15 of 439 slices shown]
[im 25/439  lung]
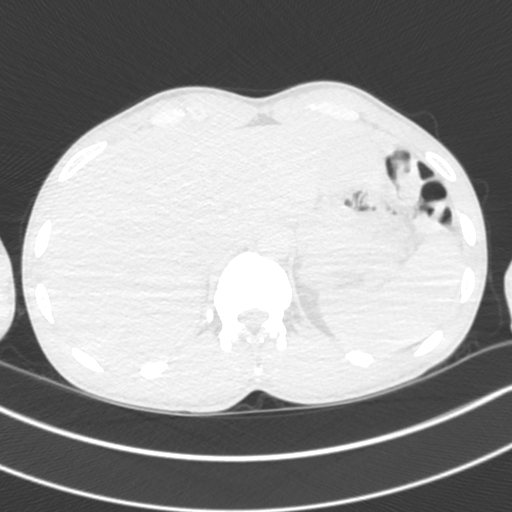
[im 49/439  soft-tissue]
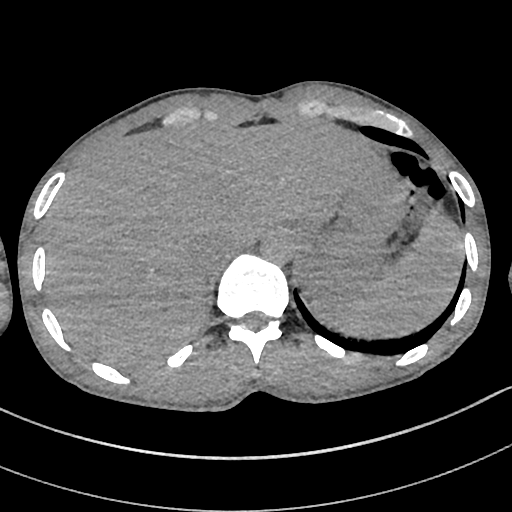
[im 74/439  lung]
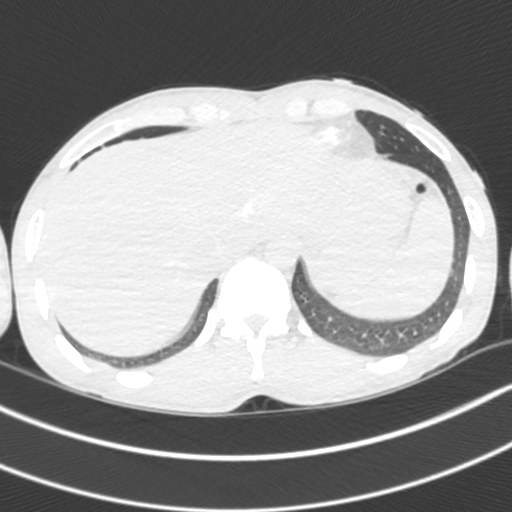
[im 98/439  soft-tissue]
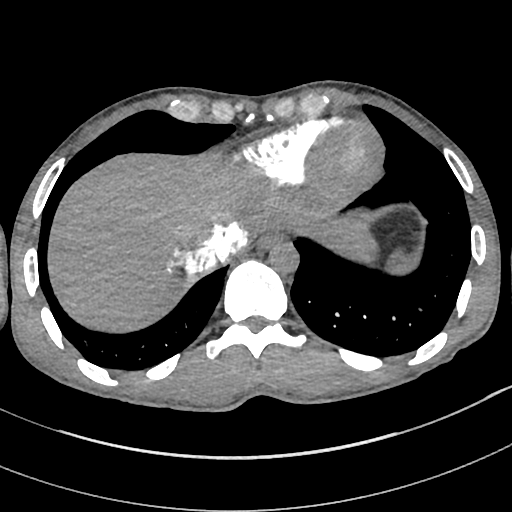
[im 147/439  lung]
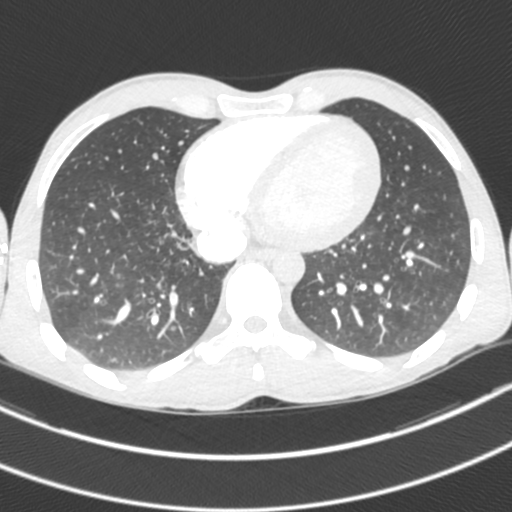
[im 171/439  soft-tissue]
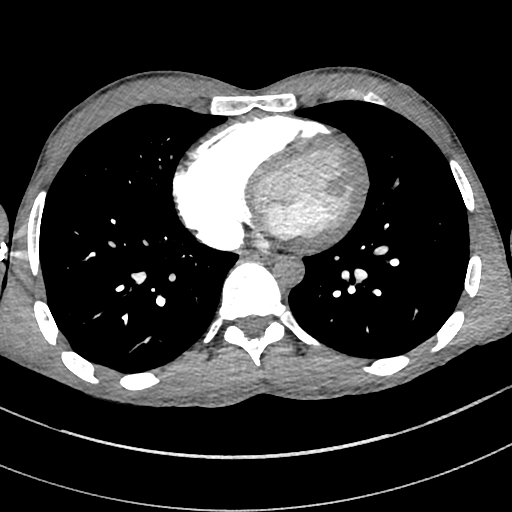
[im 195/439  lung]
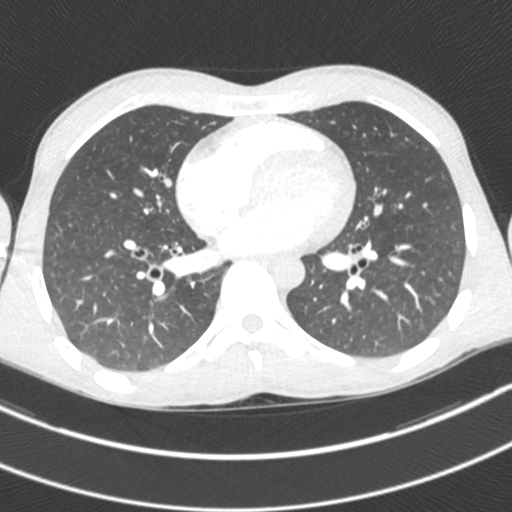
[im 220/439  soft-tissue]
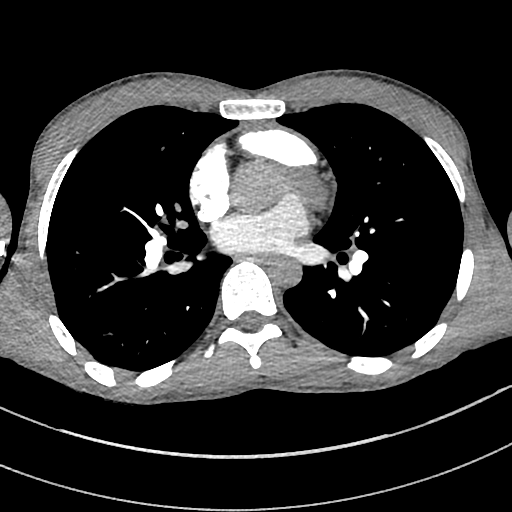
[im 244/439  lung]
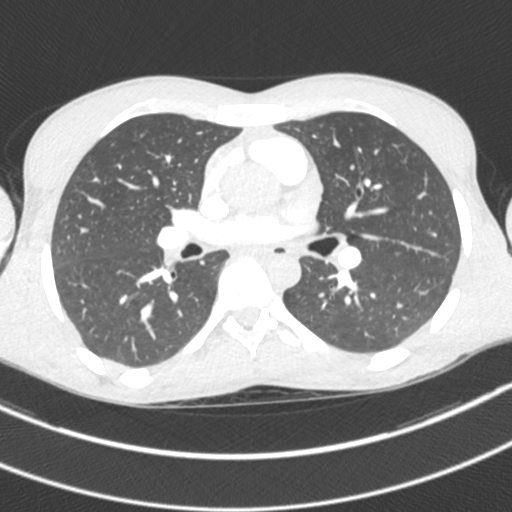
[im 268/439  soft-tissue]
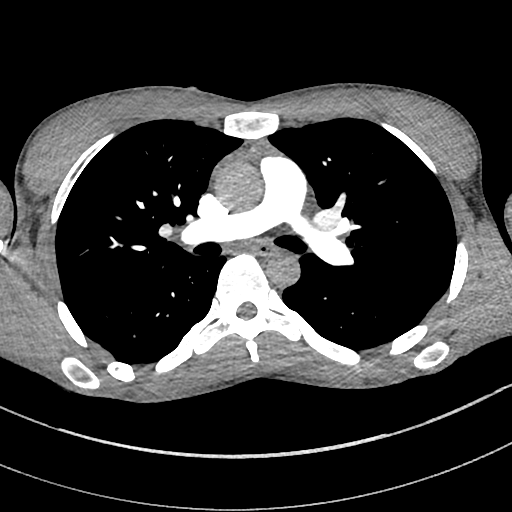
[im 293/439  lung]
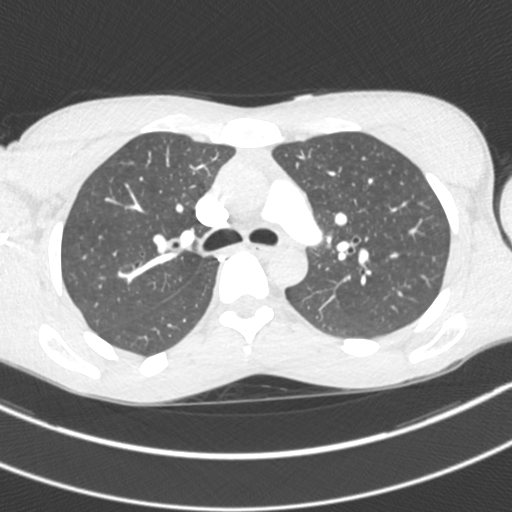
[im 341/439  soft-tissue]
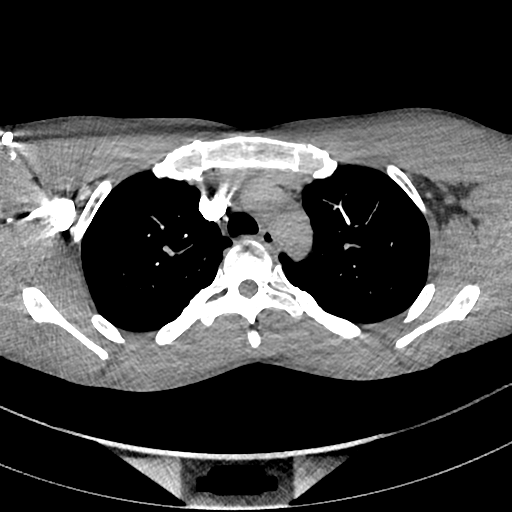
[im 366/439  lung]
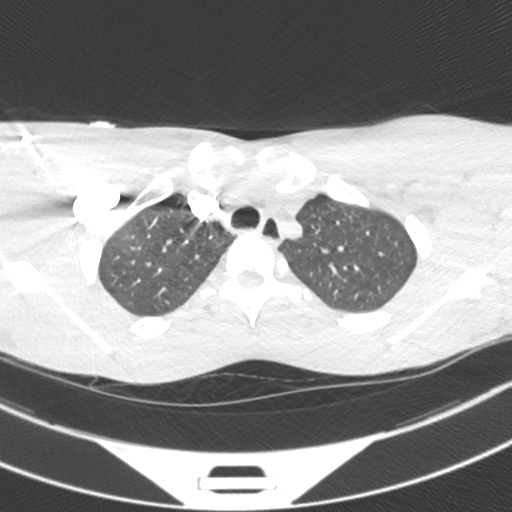
[im 390/439  soft-tissue]
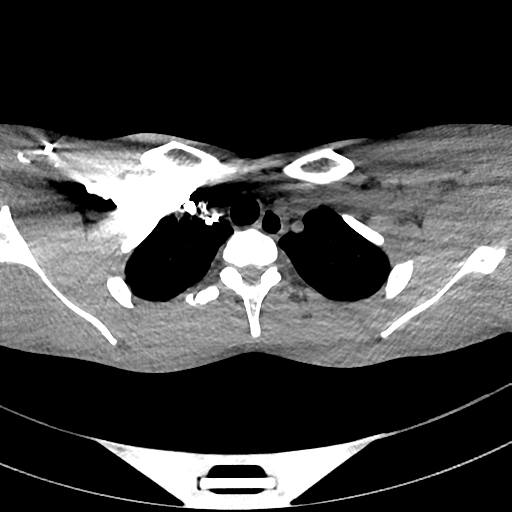
[im 414/439  lung]
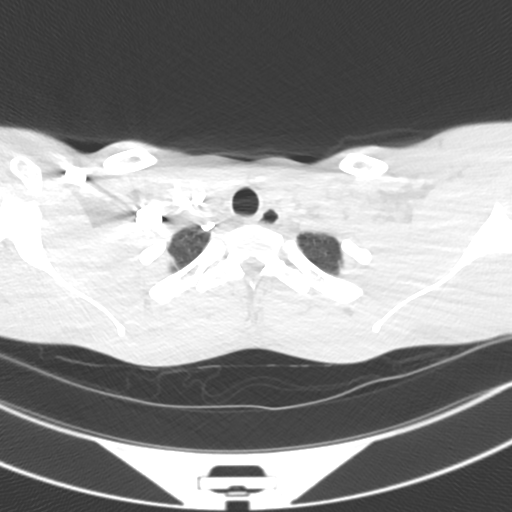

[Series 8: cor · coronal · 0.60mm/px · 3 of 112 slices shown]
[im 28/112  soft-tissue]
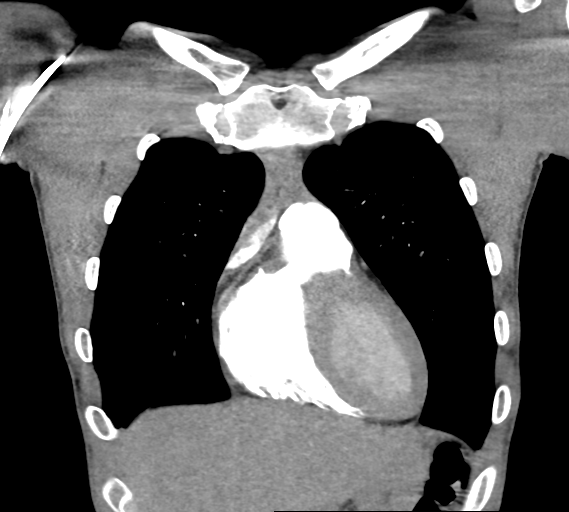
[im 56/112  soft-tissue]
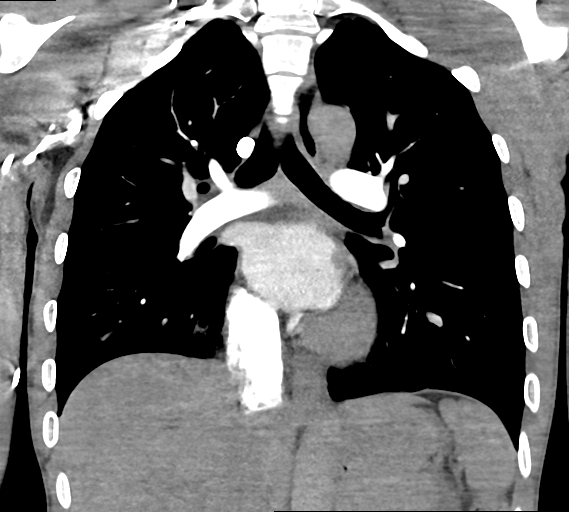
[im 84/112  soft-tissue]
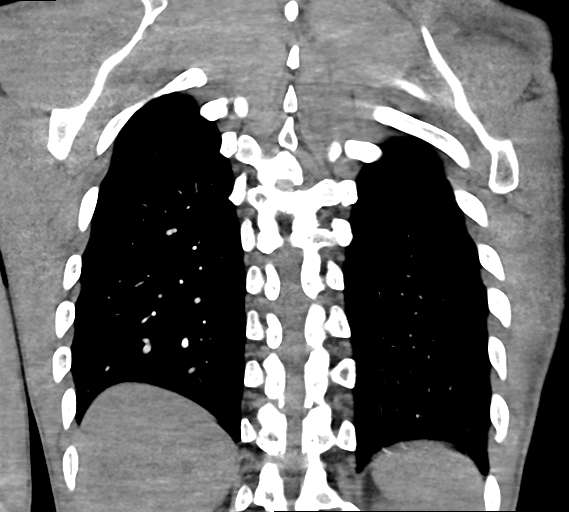

[18 of 46 positions shown; findings below may reference images not displayed]

RADIATION DOSE REDUCTION: This exam was performed according to the
departmental dose-optimization program which includes automated
exposure control, adjustment of the mA and/or kV according to
patient size and/or use of iterative reconstruction technique.

CONTRAST:  75mL OMNIPAQUE IOHEXOL 350 MG/ML SOLN
FINDINGS: Cardiovascular: Satisfactory opacification of the pulmonary arteries
to the segmental level. No evidence of pulmonary embolism. The
thoracic aorta is normal in caliber. Normal heart size. No
pericardial effusion.

Mediastinum/Nodes: No enlarged mediastinal, hilar, or axillary lymph
nodes. The thyroid gland appears normal.

Lungs/Pleura: No pleural effusion. No pneumothorax. Mild bronchial
wall thickening. No mass or focal consolidation. No suspicious
pulmonary nodules.

Musculoskeletal: No aggressive osseous lesions.

Upper abdomen: The visualized upper abdomen is unremarkable.

Review of the MIP images confirms the above findings.
IMPRESSION: 1. No findings of pulmonary embolism.
2. Mild bronchial wall thickening, nonspecific but can be seen with
viral illness or reactive/small airways disease. No consolidative
pneumonia.

## 2023-09-13 IMAGING — CT CT HEAD W/O CM
4 series · 17 of 47 positions shown, 19 images · non-contrast
Comparison: 03/17/2009

CLINICAL DATA: Severe headache, immunodeficiency



[Series 2: head wo · axial · 0.44mm/px · z∈[-92,+28]mm · 7 of 34 slices shown, 9 images]
[im 5/34  brain]
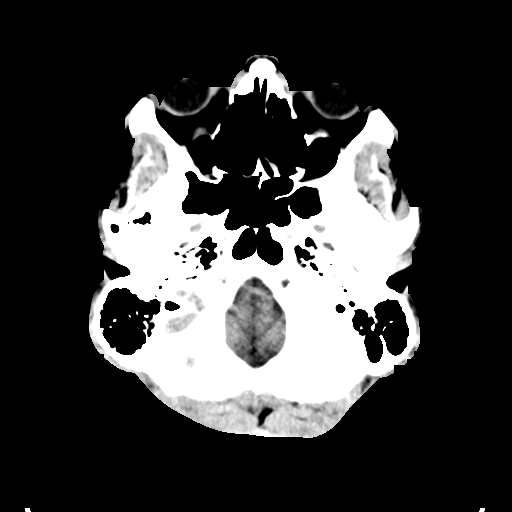
[im 5/34  bone]
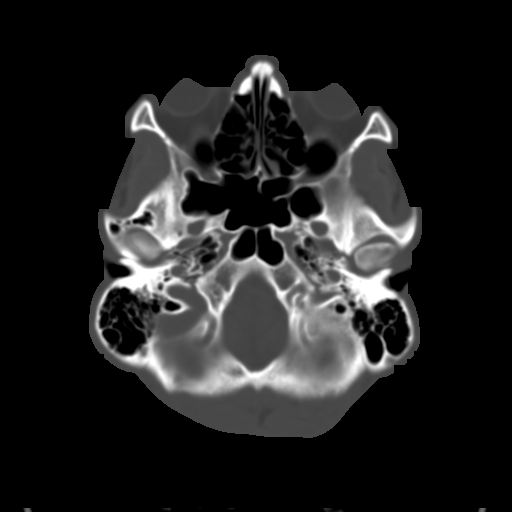
[im 9/34  brain]
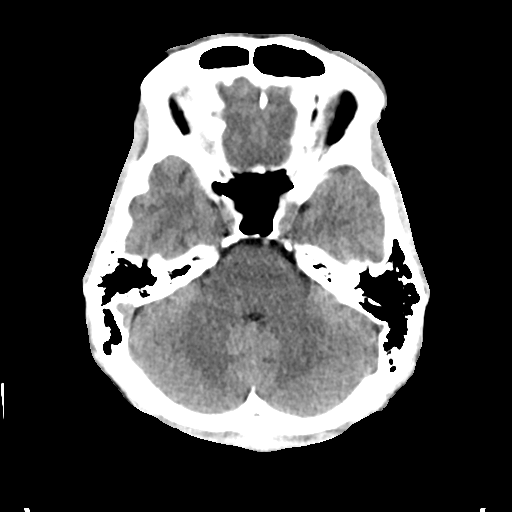
[im 13/34  brain]
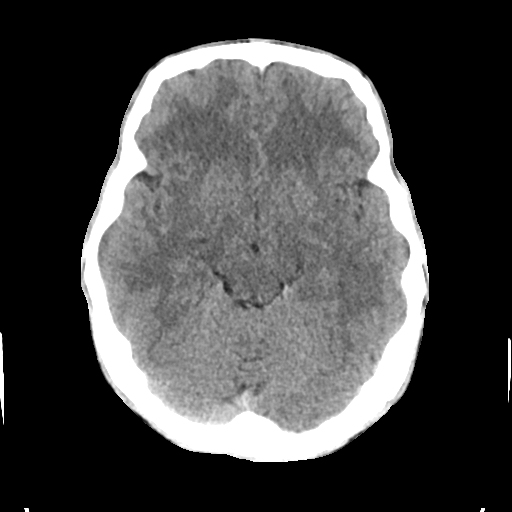
[im 17/34  brain]
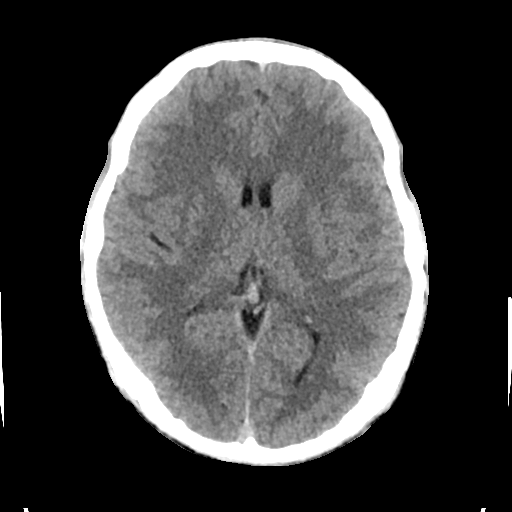
[im 21/34  brain]
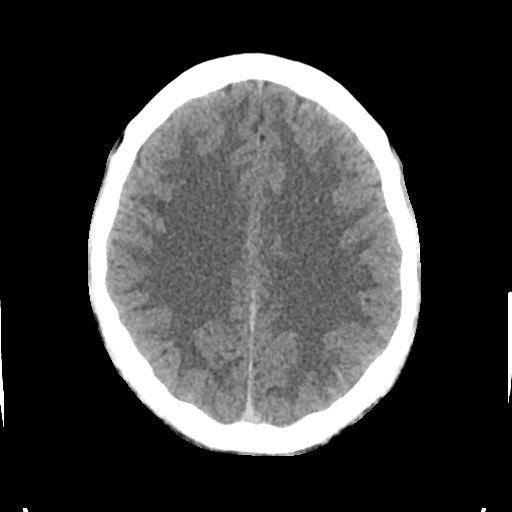
[im 21/34  bone]
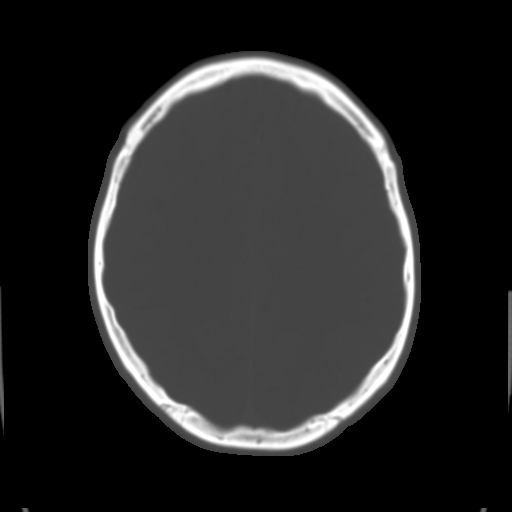
[im 25/34  brain]
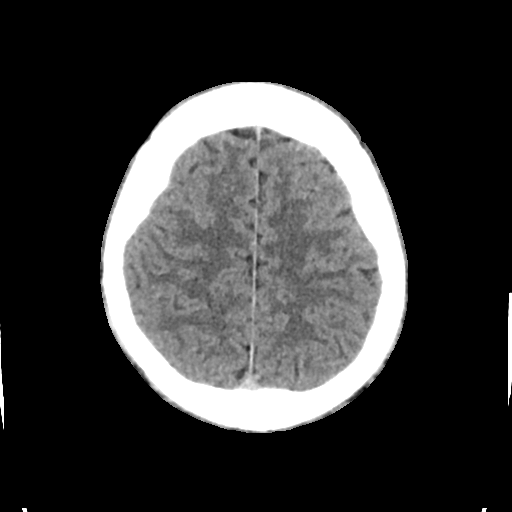
[im 29/34  brain]
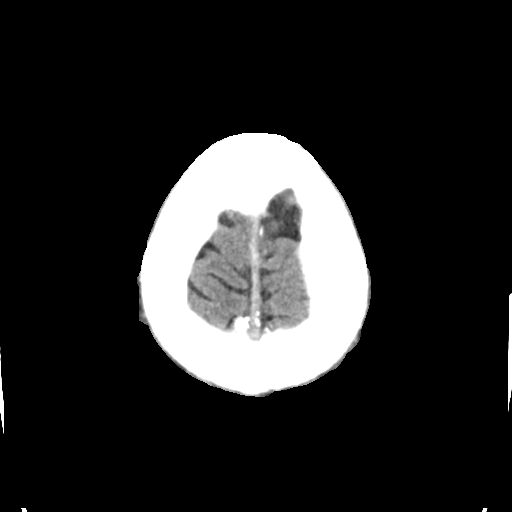

[Series 3: head bone · axial · 0.44mm/px · z∈[-96,-38]mm · 4 of 84 slices shown]
[im 9/84  bone]
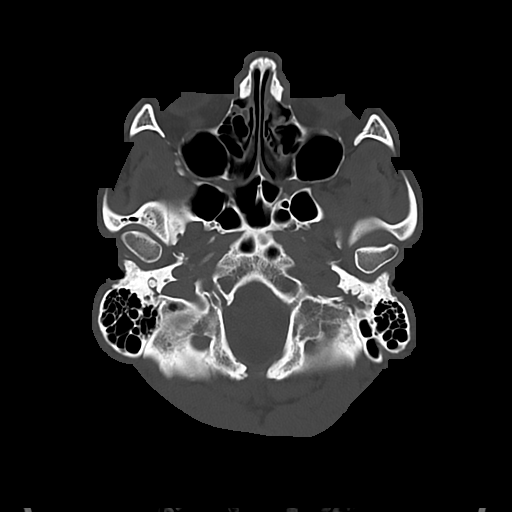
[im 17/84  bone]
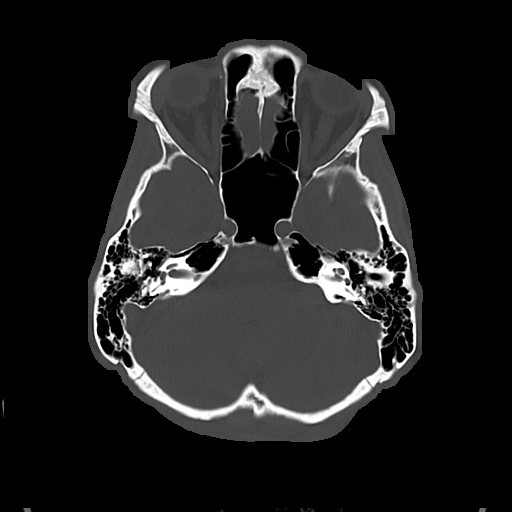
[im 25/84  bone]
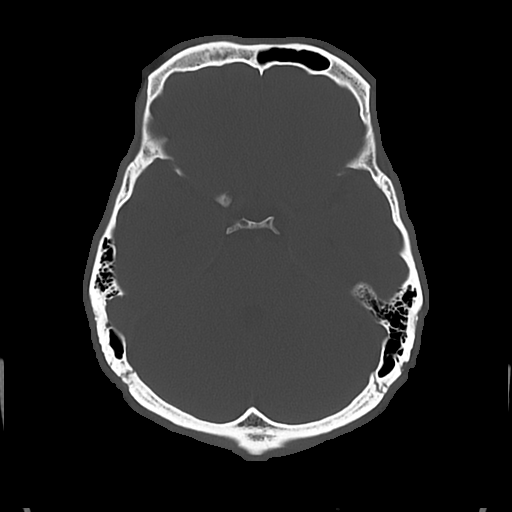
[im 38/84  bone]
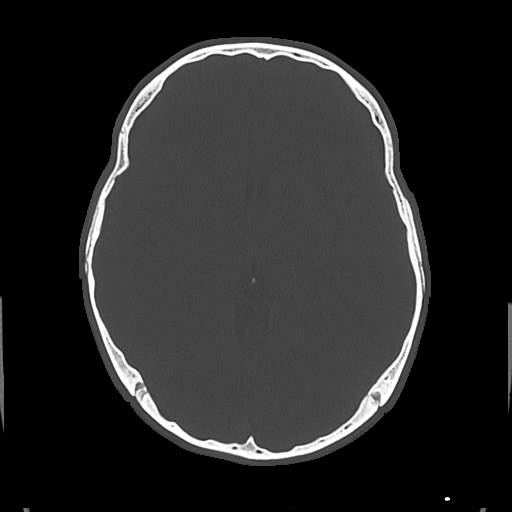

[Series 4: cor soft · coronal · 0.35mm/px · 3 of 71 slices shown]
[im 24/71  brain]
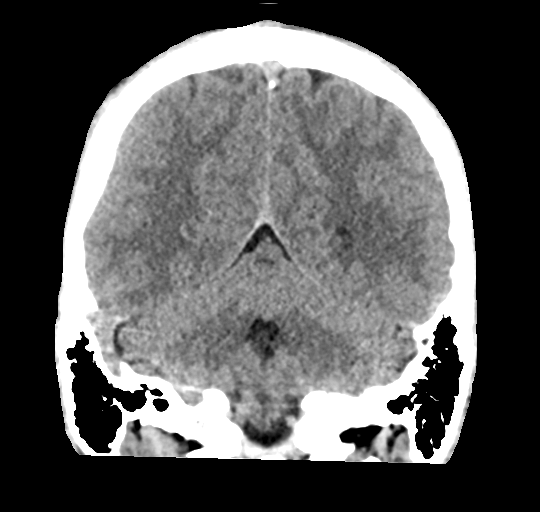
[im 32/71  brain]
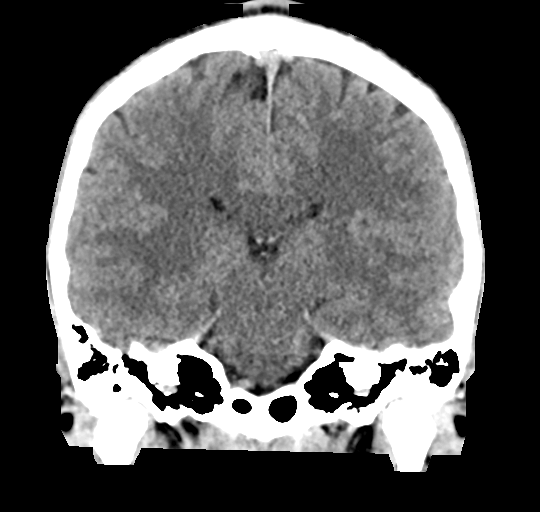
[im 39/71  brain]
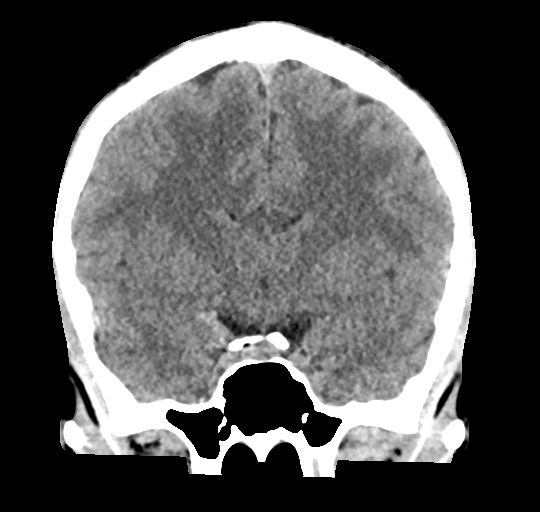

[Series 5: sag soft · sagittal · 0.35mm/px · 3 of 64 slices shown]
[im 22/64  brain]
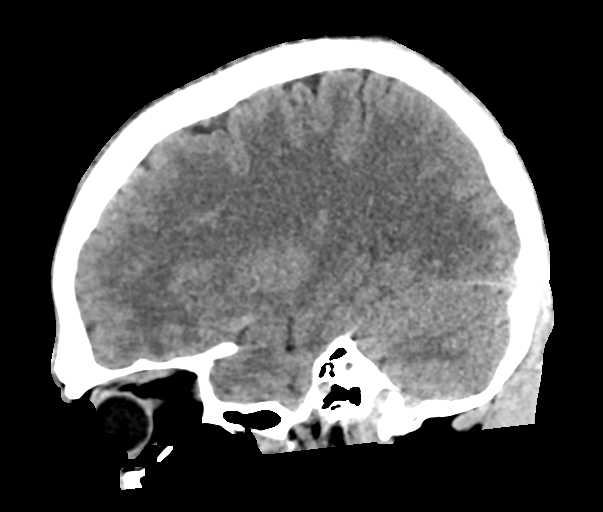
[im 32/64  brain]
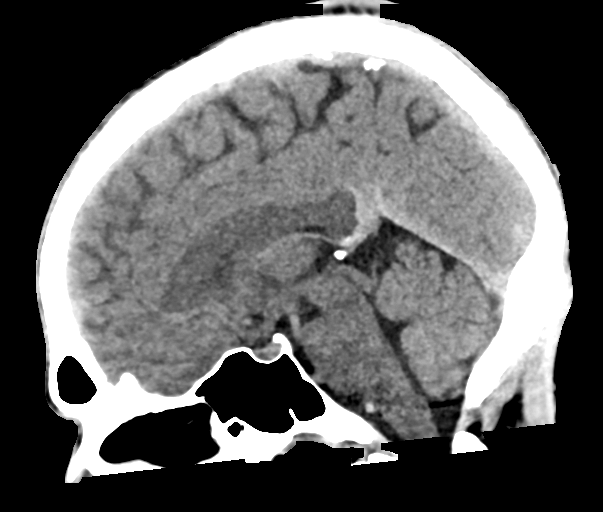
[im 43/64  brain]
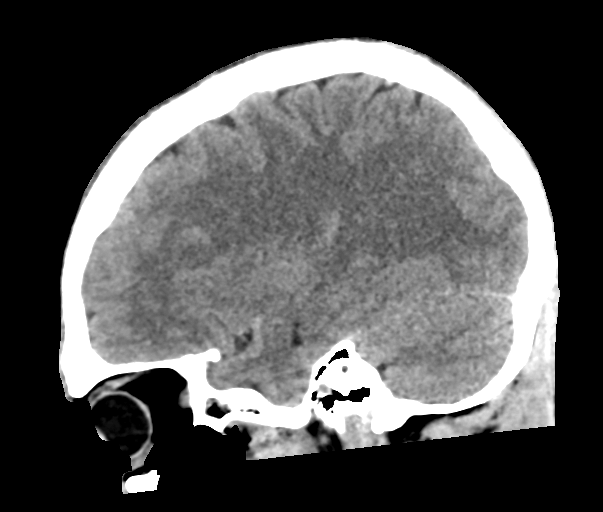

[17 of 47 positions shown; findings below may reference images not displayed]

FINDINGS: Brain: No evidence of acute infarction, hemorrhage, hydrocephalus,
extra-axial collection or mass lesion/mass effect.

Vascular: No hyperdense vessel or unexpected calcification.

Skull: Normal. Negative for fracture or focal lesion.

Sinuses/Orbits: No acute finding.

Other: None.
IMPRESSION: No acute intracranial pathology.

## 2023-09-13 IMAGING — CR DG CHEST 2V
3 series · 3 of 3 positions shown · non-contrast
Comparison: March 20, 2014

CLINICAL DATA: Chest tightness and headaches for 1 month.

EXAM:
CHEST - 2 VIEW

[chest pa]
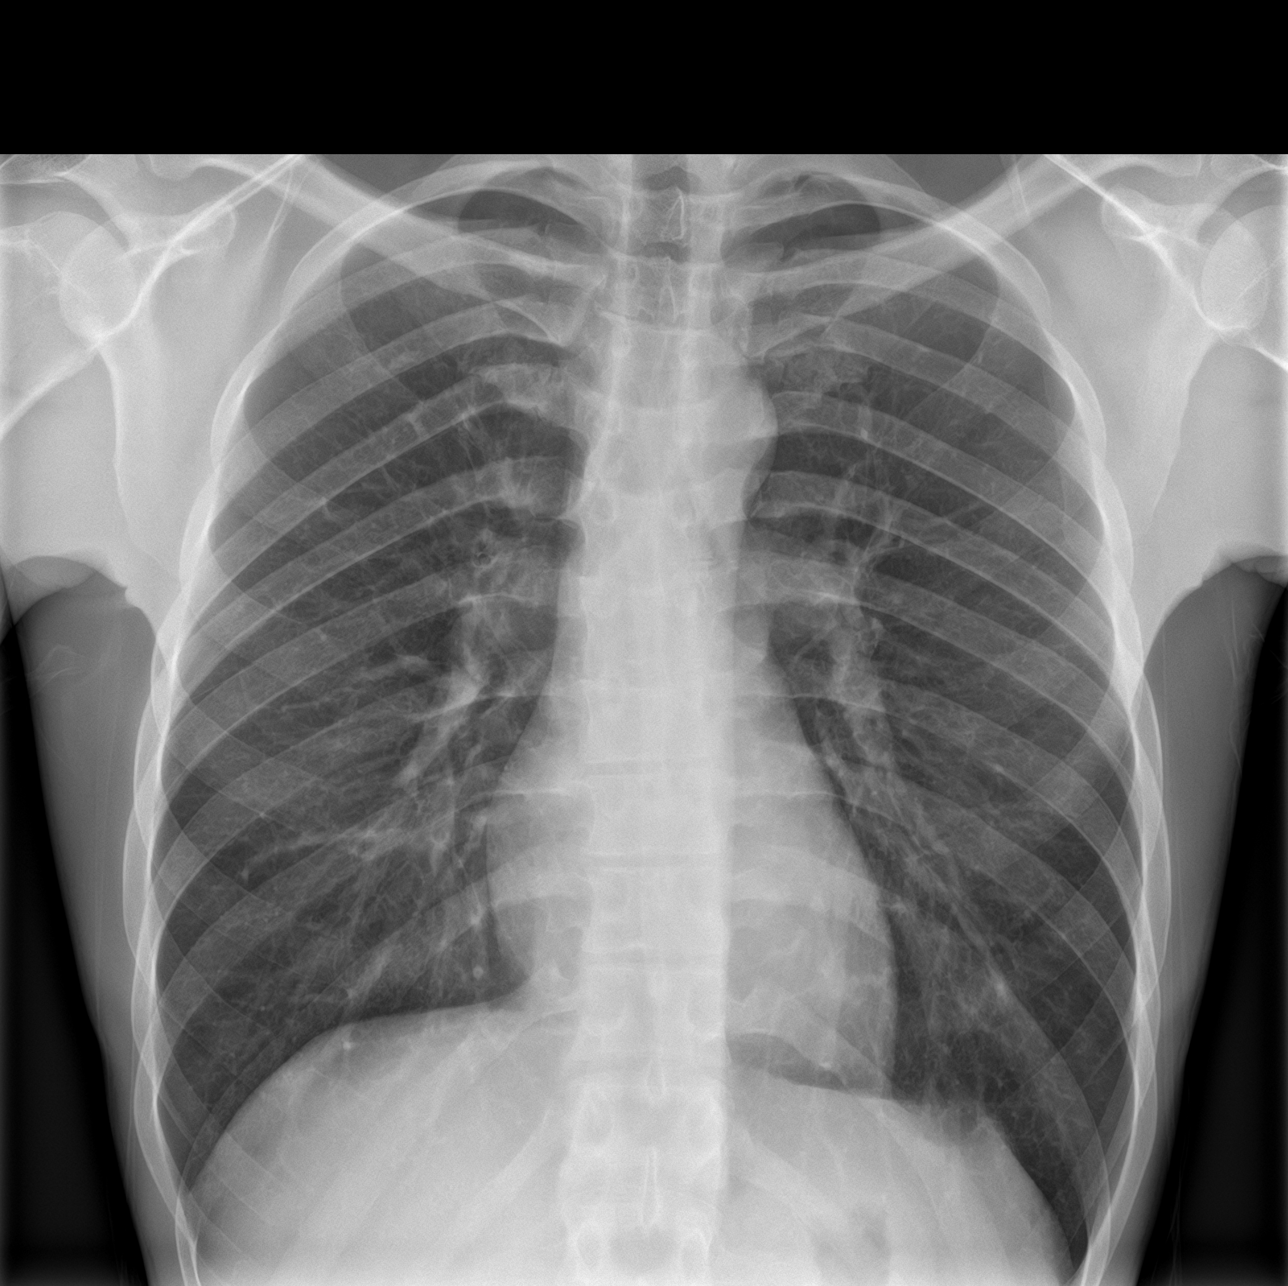

[chest lat (1 of 2)]
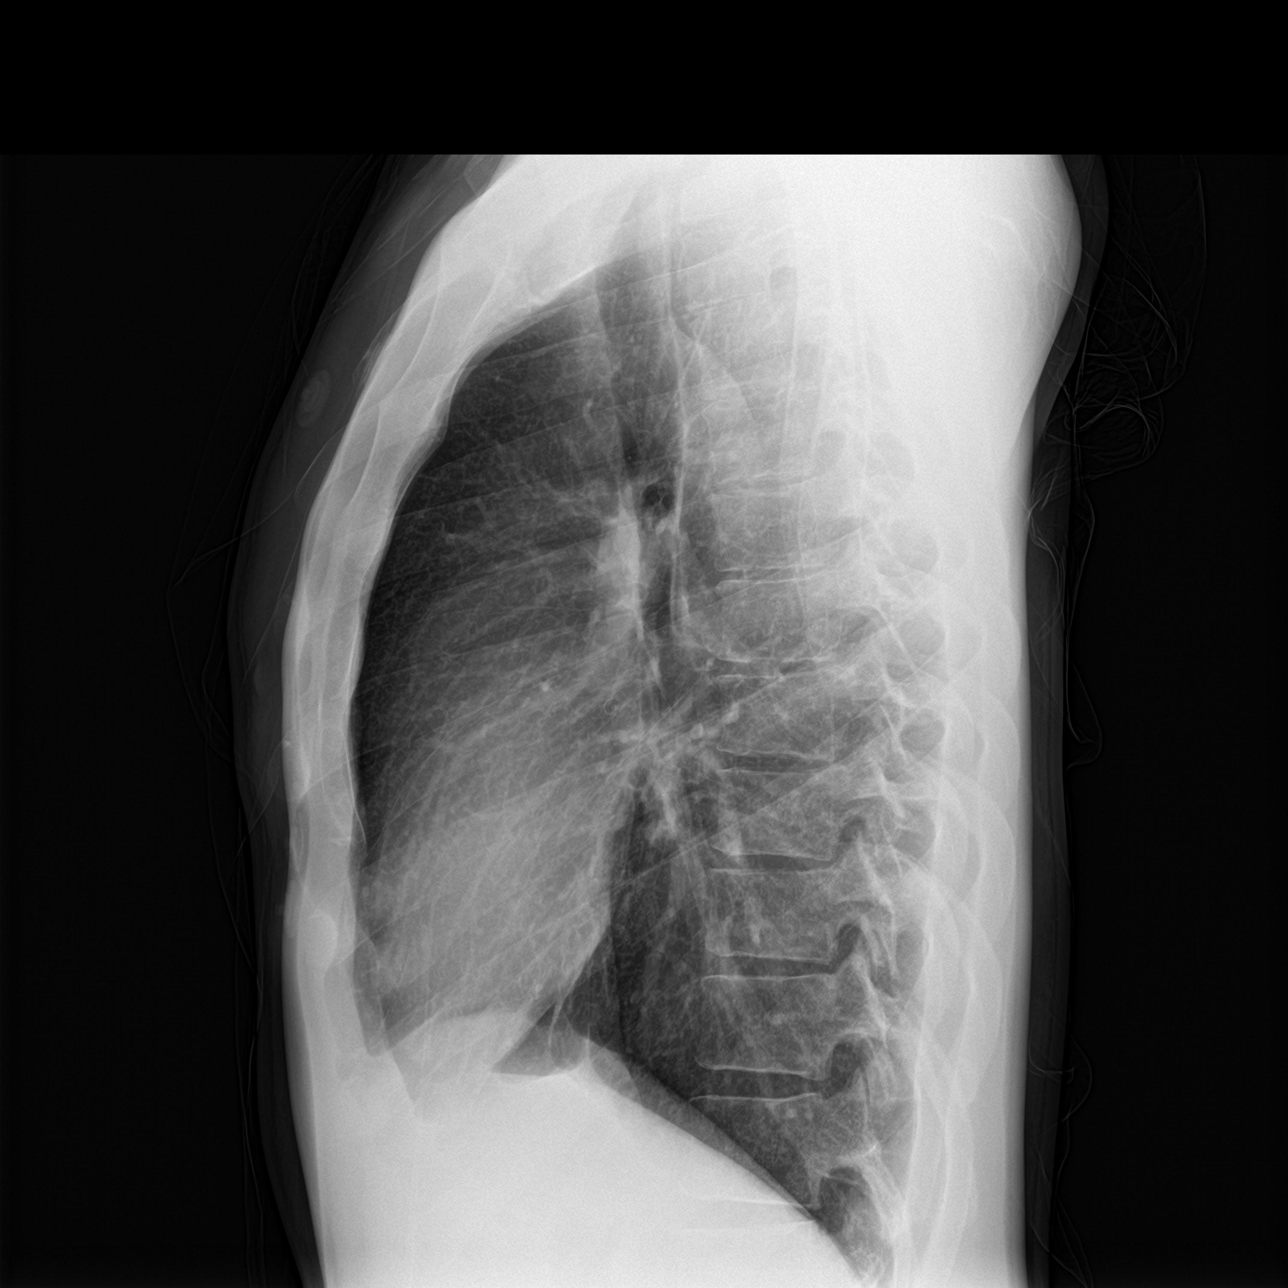

[chest lat (2 of 2)]
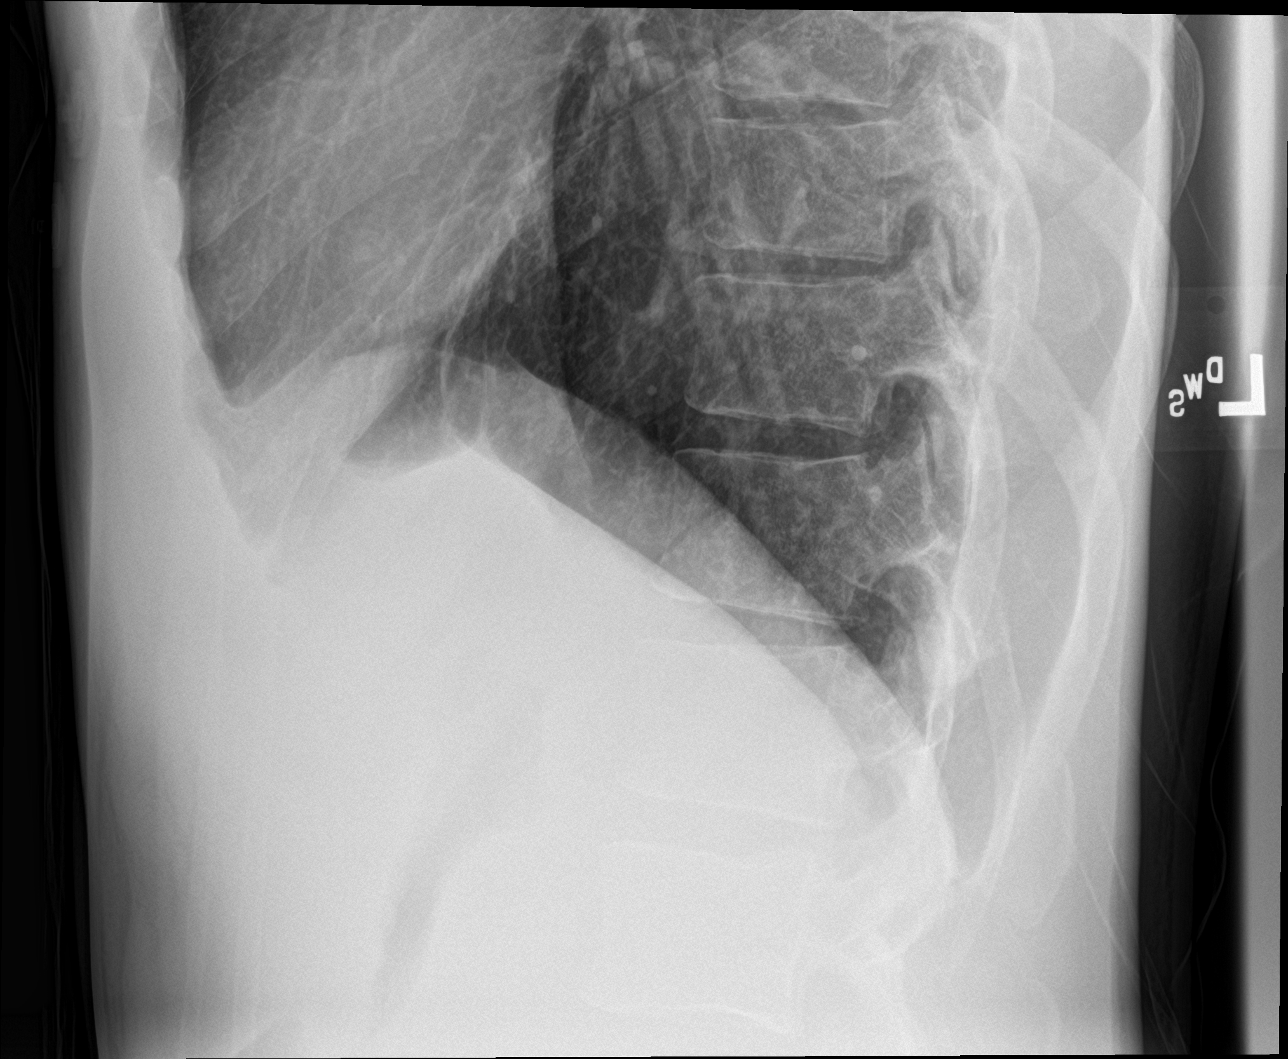

[3 of 3 positions shown; findings below may reference images not displayed]

FINDINGS: The heart size and mediastinal contours are within normal limits.
Both lungs are clear. The visualized skeletal structures are
unremarkable.
IMPRESSION: No active cardiopulmonary disease.

## 2024-02-20 ENCOUNTER — Ambulatory Visit: Payer: Self-pay | Admitting: Nurse Practitioner
